# Patient Record
Sex: Female | Born: 1980 | Race: White | Hispanic: No | Marital: Married | Smoking: Current every day smoker
Health system: Southern US, Community
[De-identification: ages and names within clinical notes are randomized; demographics above are authoritative.]

## PROBLEM LIST (undated history)

## (undated) DIAGNOSIS — K649 Unspecified hemorrhoids: Secondary | ICD-10-CM

## (undated) DIAGNOSIS — K5900 Constipation, unspecified: Secondary | ICD-10-CM

## (undated) HISTORY — PX: OTHER SURGICAL HISTORY: SHX169

## (undated) HISTORY — PX: APPENDECTOMY: SHX54

## (undated) HISTORY — PX: ENDOMETRIAL ABLATION: SHX621

---
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
## 1998-07-17 ENCOUNTER — Other Ambulatory Visit: Admission: RE | Admit: 1998-07-17 | Discharge: 1998-07-17 | Payer: Self-pay | Admitting: Emergency Medicine

## 1998-08-16 ENCOUNTER — Ambulatory Visit (HOSPITAL_COMMUNITY): Admission: RE | Admit: 1998-08-16 | Discharge: 1998-08-16 | Payer: Self-pay | Admitting: Obstetrics and Gynecology

## 1998-08-16 ENCOUNTER — Encounter: Payer: Self-pay | Admitting: Obstetrics and Gynecology

## 1998-11-22 ENCOUNTER — Encounter: Payer: Self-pay | Admitting: Obstetrics and Gynecology

## 1998-11-22 ENCOUNTER — Ambulatory Visit (HOSPITAL_COMMUNITY): Admission: RE | Admit: 1998-11-22 | Discharge: 1998-11-22 | Payer: Self-pay | Admitting: Obstetrics and Gynecology

## 1998-12-13 ENCOUNTER — Inpatient Hospital Stay (HOSPITAL_COMMUNITY): Admission: AD | Admit: 1998-12-13 | Discharge: 1998-12-13 | Payer: Self-pay | Admitting: Obstetrics and Gynecology

## 1998-12-18 ENCOUNTER — Inpatient Hospital Stay (HOSPITAL_COMMUNITY): Admission: AD | Admit: 1998-12-18 | Discharge: 1998-12-20 | Payer: Self-pay | Admitting: Obstetrics and Gynecology

## 1999-07-30 ENCOUNTER — Other Ambulatory Visit: Admission: RE | Admit: 1999-07-30 | Discharge: 1999-07-30 | Payer: Self-pay | Admitting: Obstetrics and Gynecology

## 2000-12-18 ENCOUNTER — Emergency Department (HOSPITAL_COMMUNITY): Admission: EM | Admit: 2000-12-18 | Discharge: 2000-12-19 | Payer: Self-pay | Admitting: *Deleted

## 2002-06-24 ENCOUNTER — Other Ambulatory Visit: Admission: RE | Admit: 2002-06-24 | Discharge: 2002-06-24 | Payer: Self-pay | Admitting: Obstetrics and Gynecology

## 2004-07-26 ENCOUNTER — Other Ambulatory Visit: Admission: RE | Admit: 2004-07-26 | Discharge: 2004-07-26 | Payer: Self-pay | Admitting: Obstetrics and Gynecology

## 2005-08-07 ENCOUNTER — Other Ambulatory Visit: Admission: RE | Admit: 2005-08-07 | Discharge: 2005-08-07 | Payer: Self-pay | Admitting: Obstetrics and Gynecology

## 2005-09-05 ENCOUNTER — Ambulatory Visit (HOSPITAL_COMMUNITY): Admission: RE | Admit: 2005-09-05 | Discharge: 2005-09-05 | Payer: Self-pay | Admitting: Obstetrics and Gynecology

## 2009-04-24 ENCOUNTER — Emergency Department (HOSPITAL_COMMUNITY): Admission: EM | Admit: 2009-04-24 | Discharge: 2009-04-24 | Payer: Self-pay | Admitting: Emergency Medicine

## 2009-07-26 ENCOUNTER — Other Ambulatory Visit: Admission: RE | Admit: 2009-07-26 | Discharge: 2009-07-26 | Payer: Self-pay | Admitting: Obstetrics and Gynecology

## 2009-08-09 ENCOUNTER — Ambulatory Visit: Payer: Self-pay | Admitting: Internal Medicine

## 2009-08-09 DIAGNOSIS — K5909 Other constipation: Secondary | ICD-10-CM | POA: Insufficient documentation

## 2009-08-09 DIAGNOSIS — G43909 Migraine, unspecified, not intractable, without status migrainosus: Secondary | ICD-10-CM | POA: Insufficient documentation

## 2009-08-09 DIAGNOSIS — K219 Gastro-esophageal reflux disease without esophagitis: Secondary | ICD-10-CM | POA: Insufficient documentation

## 2009-08-10 ENCOUNTER — Encounter: Payer: Self-pay | Admitting: Internal Medicine

## 2009-08-13 ENCOUNTER — Encounter: Payer: Self-pay | Admitting: Internal Medicine

## 2009-08-16 ENCOUNTER — Ambulatory Visit (HOSPITAL_COMMUNITY): Admission: RE | Admit: 2009-08-16 | Discharge: 2009-08-16 | Payer: Self-pay | Admitting: Internal Medicine

## 2009-08-16 ENCOUNTER — Ambulatory Visit: Payer: Self-pay | Admitting: Internal Medicine

## 2009-08-16 HISTORY — PX: COLONOSCOPY: SHX174

## 2009-09-11 ENCOUNTER — Telehealth (INDEPENDENT_AMBULATORY_CARE_PROVIDER_SITE_OTHER): Payer: Self-pay

## 2009-09-11 ENCOUNTER — Encounter: Payer: Self-pay | Admitting: Urgent Care

## 2009-09-12 ENCOUNTER — Telehealth (INDEPENDENT_AMBULATORY_CARE_PROVIDER_SITE_OTHER): Payer: Self-pay

## 2009-09-13 ENCOUNTER — Telehealth (INDEPENDENT_AMBULATORY_CARE_PROVIDER_SITE_OTHER): Payer: Self-pay

## 2009-09-14 ENCOUNTER — Ambulatory Visit: Payer: Self-pay | Admitting: Internal Medicine

## 2009-09-14 DIAGNOSIS — K645 Perianal venous thrombosis: Secondary | ICD-10-CM

## 2009-09-14 DIAGNOSIS — K5730 Diverticulosis of large intestine without perforation or abscess without bleeding: Secondary | ICD-10-CM | POA: Insufficient documentation

## 2009-09-20 ENCOUNTER — Telehealth (INDEPENDENT_AMBULATORY_CARE_PROVIDER_SITE_OTHER): Payer: Self-pay | Admitting: *Deleted

## 2010-08-20 NOTE — Assessment & Plan Note (Signed)
Summary: NPP/ABD PAIN,CONSTIPATION,RECTAL BLEEDING,GERD,HEMORRHOIDS,+ ...   Visit Type:  Initial Consult Referring Provider:  Cyril Mourning, NP  Primary Care Provider:  Ignacia Bayley  Chief Complaint:  abd pain x 6 months, rectal bleeding x 1 year, and constipation.  History of Present Illness: 30 y/o caucasian female w/ chronic constipation.  c/o bright red blood like a period w/BM q monthy.  BM q3 days usually with scant bleeding.  Denies proctalgia or pruritis.  c/o lower abd bilat pain resolves w/ defecation.  Tried metamucil no help, suppositories no help.  "Keep hemorrhoids" & OTC meds, no relief.   The patient complains of incomplete evacuation and previous history of constipation, but denies diarrhea, nausea, vomiting, flatus, and constipation in childhood.    c/o heartburn every other day, no particular time of day.  Deneis dysphagia or odynophagia.  TUMS helps. There is no history of melena, dysphagia, hematemesis, persistent vomiting, involuntary weight loss >5%, and history of anemia.  The patient complains of regurgtaition of food, epigastric pain, and throat clearing, but denies sour taste in mouth, trouble swallowing, weight loss, weight gain, asthma, chronic cough, hoarseness, and recurrent pneumonia.  Symptoms are worse with citrus and NSAIDs.  Takes advil 600mg  two times a day almost every day for HA.    Current Problems (verified): 1)  Gerd  (ICD-530.81) 2)  Constipation, Chronic  (ICD-564.09) 3)  Hematochezia  (ICD-578.1) 4)  Migraine, Chronic  (ICD-346.90)  Current Medications (verified): 1)  Vitamin D .... Once Daily  Allergies (verified): No Known Drug Allergies  Past History:  Past Medical History: migraines endometriosis chronic constipation hemorrhoids GERD  Past Surgical History: Appendectomy Endometrial ablation-non longer has menses   Family History: No known family history of colorectal carcinoma, IBD, liver or chronic GI  problems. Father: (56)HTN Mother: (92) CAD, CVA, DM Siblings: (1 deceased age 65) MVA, CAD, HTN  Social History: married (1999) 1 son (10) healthy & step-daughter (15)  unemployed Designer, industrial/product Patient currently smokes.  8 pkyr, 1/2 ppd Alcohol Use - no Daily Caffeine Use Illicit Drug Use - no Patient gets regular exercise. Smoking Status:  current Drug Use:  no Does Patient Exercise:  yes  Review of Systems General:  Denies fever, chills, sweats, anorexia, fatigue, weakness, malaise, weight loss, and sleep disorder. ENT:  Denies earache, ear discharge, tinnitus, decreased hearing, nasal congestion, loss of smell, nosebleeds, sore throat, hoarseness, and difficulty swallowing. CV:  Denies chest pains, angina, palpitations, syncope, dyspnea on exertion, orthopnea, PND, peripheral edema, and claudication. Resp:  Denies dyspnea at rest, dyspnea with exercise, cough, sputum, wheezing, coughing up blood, and pleurisy. GI:  Denies difficulty swallowing, pain on swallowing, nausea, vomiting, vomiting blood, jaundice, and fecal incontinence. GU:  Complains of amenorrhea; denies urinary burning, blood in urine, nocturnal urination, urinary frequency, and urinary incontinence. Derm:  Denies rash, itching, dry skin, hives, moles, warts, and unhealing ulcers. Psych:  Denies depression, anxiety, memory loss, suicidal ideation, hallucinations, paranoia, phobia, and confusion. Heme:  Denies bruising and enlarged lymph nodes.  Vital Signs:  Patient profile:   30 year old female Height:      64 inches Weight:      170 pounds BMI:     29.29 Temp:     98.5 degrees F oral Pulse rate:   64 / minute BP sitting:   124 / 72  (left arm) Cuff size:   regular  Vitals Entered By: Hendricks Limes LPN (August 09, 2009 10:52 AM)  Physical Exam  General:  Well developed,  well nourished, no acute distress. Head:  Normocephalic and atraumatic. Eyes:  Sclera clear, no icterus. Ears:  Normal auditory  acuity. Nose:  No deformity, discharge,  or lesions. Mouth:  No deformity or lesions, dentition normal. Neck:  Supple; no masses or thyromegaly. Lungs:  Clear throughout to auscultation. Heart:  Regular rate and rhythm; no murmurs, rubs,  or bruits. Abdomen:  Soft, nontender and nondistended. No masses, hepatosplenomegaly or hernias noted. Normal bowel sounds.without guarding and without rebound.   Rectal:  deferred until time of colonoscopy.   Msk:  Symmetrical with no gross deformities. Normal posture. Pulses:  Normal pulses noted. Extremities:  No clubbing, cyanosis, edema or deformities noted. Neurologic:  Alert and  oriented x4;  grossly normal neurologically. Skin:  Intact without significant lesions or rashes. Cervical Nodes:  No significant cervical adenopathy. Psych:  Alert and cooperative. Normal mood and affect.  Impression & Recommendations:  Problem # 1:  HEMATOCHEZIA (ICD-578.1) 30 y/o caucasian female with intermittant large volume hematochezia.  She also has hx of chronic constipation & hemorrhoids.  Uses NSAIDs regularly.  She is going to need further evaluation via colonoscopy to determine etiology of bleeding.  Differentials include hemorrhoids, fissure, diverticula, NSAID-induced changes, less likely IBD or colorectal CA.   Diagnostic colonoscopy to be performed by Dr. Suszanne Conners Rourk in the near future.  I have discussed risks and benefits which include, but are not limited to, bleeding, infection, perforation, or medication reaction.  The patient agrees with this plan and consent will be obtained.  Orders: Consultation Level IV (91478)  Problem # 2:  CONSTIPATION, CHRONIC (ICD-564.09) See #1  Problem # 3:  GERD (ICD-530.81) Never been on PPI with almost daily symptoms of heartburn.  Trial PPI first.  If no response, consider EGD.  Patient Instructions: 1)  Samples prilosec 20mg  daily #4 pks 2)  Samples miralax 17 grams #4 pks 3)  GERD/constipation literature  given 4)  Avoid advil for now, use tylenol arthritis Prescriptions: MIRALAX  POWD (POLYETHYLENE GLYCOL 3350) 17 grams by mouth daily as needed constipation  #527 grams x 11   Entered and Authorized by:   Joselyn Arrow FNP-BC   Signed by:   Joselyn Arrow FNP-BC on 08/09/2009   Method used:   Electronically to        [facility]  [address] (retail)       [address]       [place], [place]  [address]       Ph: [phone]       Fax: [phone]   RxID:   [id number] OMEPRAZOLE 20 MG CPDR (OMEPRAZOLE) one by mouth 30 mins before breakfast daily  #30 x 1   Entered and Authorized by:   Joselyn Arrow FNP-BC   Signed by:   Joselyn Arrow FNP-BC on 08/09/2009   Method used:   Electronically to        [facility]  [address] (retail)       [address]       [place], [place]  [address]       Ph: [phone]       Fax: [phone]   RxID:   [id number]   Appended Document: NPP/ABD PAIN,CONSTIPATION,RECTAL BLEEDING,GERD,HEMORRHOIDS,+ ... 07/28/2008 labs:  CBC normal, h pylori negative, CMP normal, TSH normal

## 2010-08-20 NOTE — Assessment & Plan Note (Signed)
Summary: PP FU WITH EXTENDER IN ONE MONTH/SS   Primary Care Provider:  Ignacia Bayley  Chief Complaint:  follow up- still having problems with hemorroids.  History of Present Illness: 30 y/o caucasian female w/ recent colonoscopy by Dr Jena Gauss.  c/o"hemorrhoid flare"  Took all anusol  suppositories, prep H.  Now on Anamantle.  c/o severe proctalgia.  "can't sit"  No bleeding.  BM once daily w/ hard stools.  On stool softeners once daily.  On omeprazole for GERD.  Denies abd pain.  The patient denies any upper GI symptoms which incluse nausea, vomiting, heartburn, indigestion, dysphagia, odynophagia, anorexia or early satiety.   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 Once Daily 2)  Omeprazole 20 Mg Cpdr (Omeprazole) .... One By Mouth 30 Mins Before Breakfast Daily 3)  Miralax  Powd (Polyethylene Glycol 3350) .Marland KitchenMarland KitchenMarland Kitchen 17 Grams By Mouth Daily As Needed Constipation 4)  Anamantle Hc Forte 3-1 % Kit (Lidocaine-Hydrocortisone Ace) .... One Kit Pr Bid  Allergies (verified): No Known Drug Allergies  Past History:  Past Surgical History: Last updated: 08/09/2009 Appendectomy Endometrial ablation-non longer has menses   Past Medical History: colonoscopy(08/16/09)->hemorrhoids, friable anal canal, diverticulosis migraines endometriosis chronic constipation hemorrhoids GERD  Review of Systems      See HPI General:  Denies fever, chills, sweats, anorexia, fatigue, weakness, malaise, weight loss, and sleep disorder.  Vital Signs:  Patient profile:   30 year old female Height:      64 inches Weight:      169 pounds BMI:     29.11 Temp:     98.3 degrees F oral Pulse rate:   72 / minute BP sitting:   122 / 80  (left arm) Cuff size:   regular  Vitals  Entered By: Hendricks Limes LPN (September 14, 2009 11:04 AM)  Physical Exam  General:  Well developed, well nourished, no acute distress. Head:  Normocephalic and atraumatic. Eyes:  Sclera clear.  Conjunctivae pink Heart:  Regular rate and rhythm; no murmurs, rubs,  or bruits. Abdomen:  Umbilical ornament.  Soft, nontender and nondistended. No masses, hepatosplenomegaly or hernias noted. Normal bowel sounds.without guarding and without rebound.   Rectal:  lg external hemorrhoid and perirectal tenderness. Appears thrombosed at present.  Several other smaller ext hemorrhoids. Msk:  Symmetrical with no gross deformities. Normal posture. Neurologic:  Alert and  oriented x4;  grossly normal neurologically. Skin:  Intact without significant lesions or rashes. Psych:  Alert and cooperative. Normal mood and affect.  Impression & Recommendations:  Problem # 1:  EXTERNAL HEMORRHOIDS, THROMBOSED (ICD-455.4) Referral to surgery.  Sitz bath instructions given.  Orders: Est. Patient Level II ([address])  Problem # 2:  CONSTIPATION, CHRONIC (ICD-564.09) Increase stool softener to BID  Problem # 3:  GERD (ICD-530.81) Well-controlled.  Cont daily PPI

## 2010-08-20 NOTE — Progress Notes (Signed)
Summary: Hemorrhoids  Phone Note Call from Patient   Caller: Patient Summary of Call: Pt called and said she is still having difficulty with hemorrhoids. She completed the suppositories that was given. She has been using Prep H, cream and tucks and is having alot of problems. She uses [facility]  [phone]. Initial call taken by: Cloria Spring LPN,  September 11, 2009 9:39 AM     Appended Document: RX Trial Anamantle to [facility].  Disregard [facility] please.  Keep FU OV as planned.  Prescriptions: ANAMANTLE HC FORTE 3-1 % KIT (LIDOCAINE-HYDROCORTISONE ACE) one kit PR BID  #20 x 0   Entered and Authorized by:   Joselyn Arrow FNP-BC   Signed by:   Joselyn Arrow FNP-BC on 09/11/2009   Method used:   Electronically to        [facility] (retail)       [address] [place], [place]  [address]       Ph: [phone] or [phone]       Fax: [phone]   RxID:   [id number] ANAMANTLE HC FORTE 3-1 % KIT (LIDOCAINE-HYDROCORTISONE ACE) one kit PR BID  #20 x 0   Entered and Authorized by:   Joselyn Arrow FNP-BC   Signed by:   Joselyn Arrow FNP-BC on 09/11/2009   Method used:   Electronically to        [facility]  [address] (retail)       [address]       [place], [place]  [address]       Ph: [phone]       Fax: [phone]   RxID:   [id number]     Appended Document: Hemorrhoids Pt informed. Said she does not have a follow-up appt. Told her Ginger will call to schedule.  Appended Document: Hemorrhoids pt does have an appt 09/14/09. called and spoke with "amber" to remind Attie of her appt.

## 2010-08-20 NOTE — Progress Notes (Signed)
Summary: med for hemorrhoids not helping much  Phone Note Call from Patient   Caller: Patient Summary of Call: Pt called and said she is very swollen and burning from her hemorrhoids and the medicine is not doing very much good. York Spaniel  how long does it take for it to work well, she is miserable. (Her call back number is [phone]) Initial call taken by: Cloria Spring LPN,  September 13, 2009 11:18 AM     Appended Document: med for hemorrhoids not helping much Takes a couple days, can try sitz bath, wipes, & can ask RMR if she wants surgical referral?  Appended Document: med for hemorrhoids not helping much LMOM to call.  Appended Document: med for hemorrhoids not helping much Pt informed, and said she would like to have a referral to a surgeon. Please advise.  Appended Document: med for hemorrhoids not helping much agree

## 2010-08-20 NOTE — Letter (Signed)
Summary: DR Esmond Camper REFERRAL  DR Esmond Camper REFERRAL   Imported By: Ave Filter 09/14/2009 11:36:20  _____________________________________________________________________  External Attachment:    Type:   Image     Comment:   External Document

## 2010-08-20 NOTE — Progress Notes (Signed)
Summary: [name]  Phone Note Call from Patient   Reason for Call: Talk to Doctor Summary of Call: Pt was seen recently and had been referred to Dr Lovell Sheehan for hemorrhoids. Pt said that she is unemployed and had to pay $100 to see Dr Lovell Sheehan and he tells her that she doesn't have a hemorrhoid problem. She said that it ticks her off that we did that to her and wanted to know why. I took her number, explained to her that KJ wasn't in the office today, but I would have the office manager call her shortly. Initial call taken by: Diana Eves,  September 20, 2009 2:49 PM     Appended Document: Narmeen Kerper [phone]  Appended Document: [name] Spoke with patient.  Explained that her flares could be intermittent and that if Dr. Lovell Sheehan felt that she did not surgery at this time that was a positive DX.  She stated that what she was unhappy about was that she had discussed with KJ the possibility of having the hemorrhoids lanced so that she would not have to worry about this problem anymore.  She stated she was upset b./c he told her she did not have a problem that required intervention and she did not have the money for that; she would have never gone if she knew nothing was going to be done.  I explained that I was sorry for what had occurred with Dr. Lovell Sheehan but that he does make the final call on whether or not a procedure would need to be done and that she should express her concerns to his office as well.  Furthermore, I explained that she should continue on the recommended medications from her last office visit and that we could even set up a referral for a second opinion if she wanted; she refused stating that she doesn't have money for that.  I reminded her to please feel free to contact us if she needed anything or had a flare.  She thanked me; call terminated.  Appended Document: [name] I agree with feedback provided to the patient.

## 2010-08-20 NOTE — Letter (Signed)
Summary: [facility] ORDER  [facility] ORDER   Imported By: Diana Eves 08/13/2009 14:04:22  _____________________________________________________________________  External Attachment:    Type:   Image     Comment:   External Document

## 2010-08-20 NOTE — Letter (Signed)
Summary: [facility]   Imported By: Diana Eves 08/10/2009 16:21:13  _____________________________________________________________________  External Attachment:    Type:   Image     Comment:   External Document

## 2010-08-20 NOTE — Medication Information (Signed)
Summary: Tax adviser   Imported By: Diana Eves 09/11/2009 14:15:03  _____________________________________________________________________  External Attachment:    Type:   Image     Comment:   External Document  Appended Document: RX Folder PLease let pharmacy know OK to substitute.  Appended Document: RX Folder Per pharmacist, already found it somewhere else.

## 2010-08-20 NOTE — Progress Notes (Signed)
Summary: [facility] pharmacy  re: Anamantle  Phone Note From Pharmacy   Caller: Annabelle Harman @ [facility] Summary of Call: Annabelle Harman from [place] called in reference to the Anamantle. I told her to disregard, pt got Rx sent to [facility] pharmacy. She said  pt was not there, she was just cleaning up some little things, and would have to use generic if they filled it. Said she will discard. Initial call taken by: Cloria Spring LPN,  September 12, 2009 10:43 AM     Appended Document: [facility] pharmacy  re: Anamantle Called pt and confirmed she did get it at [facility], she said it is helping some.

## 2010-10-02 ENCOUNTER — Other Ambulatory Visit: Payer: Self-pay | Admitting: Adult Health

## 2010-10-02 ENCOUNTER — Other Ambulatory Visit (HOSPITAL_COMMUNITY)
Admission: RE | Admit: 2010-10-02 | Discharge: 2010-10-02 | Disposition: A | Discharge status: Home / Self Care | Payer: 59 | Source: Ambulatory Visit | Attending: Obstetrics and Gynecology | Admitting: Obstetrics and Gynecology

## 2010-10-02 DIAGNOSIS — Z01419 Encounter for gynecological examination (general) (routine) without abnormal findings: Secondary | ICD-10-CM | POA: Insufficient documentation

## 2010-10-07 LAB — TSH: TSH: 1.984 u[IU]/mL (ref 0.350–4.500)

## 2010-10-07 LAB — CALCIUM: Calcium: 8.5 mg/dL (ref 8.4–10.5)

## 2010-10-25 LAB — COMPREHENSIVE METABOLIC PANEL
ALT: 27 U/L (ref 0–35)
Calcium: 8.6 mg/dL (ref 8.4–10.5)
Creatinine, Ser: 0.6 mg/dL (ref 0.4–1.2)
GFR calc Af Amer: >60 mL/min (ref >60)
GFR calc non Af Amer: >60 mL/min (ref >60)
Glucose, Bld: 94 mg/dL (ref 70–99)
Sodium: 135 mEq/L (ref 135–145)
Total Protein: 6.8 g/dL (ref 6.0–8.3)

## 2010-10-25 LAB — URINALYSIS, ROUTINE W REFLEX MICROSCOPIC
Glucose, UA: NEGATIVE mg/dL
Ketones, ur: NEGATIVE mg/dL
Nitrite: NEGATIVE
Specific Gravity, Urine: 1.019 (ref 1.005–1.030)
pH: 6 (ref 5.0–8.0)

## 2010-10-25 LAB — DIFFERENTIAL
Eosinophils Absolute: 0 10*3/uL (ref 0.0–0.7)
Lymphocytes Relative: 18 % (ref 12–46)
Lymphs Abs: 1.4 10*3/uL (ref 0.7–4.0)
Monocytes Relative: 6 % (ref 3–12)
Neutrophils Relative %: 75 % (ref 43–77)

## 2010-10-25 LAB — LIPASE, BLOOD: Lipase: 20 U/L (ref 11–59)

## 2010-10-25 LAB — CBC
Hemoglobin: 14.3 g/dL (ref 12.0–15.0)
MCHC: 33.9 g/dL (ref 30.0–36.0)
MCV: 96.6 fL (ref 78.0–100.0)
RDW: 13 % (ref 11.5–15.5)

## 2010-12-06 NOTE — Op Note (Signed)
[name], [name]                ACCOUNT NO.:  [id number]   MEDICAL RECORD NO.:  [id number]          PATIENT TYPE:  AMB   LOCATION:  SDC                           FACILITY:  [facility]   PHYSICIAN:  Zenaida Niece, M.D.DATE OF BIRTH:  1981-05-06   DATE OF PROCEDURE:  09/05/2005  DATE OF DISCHARGE:                                 OPERATIVE REPORT   PREOPERATIVE DIAGNOSIS:  Pelvic pain, dysmenorrhea, dyspareunia,  menorrhagia.   POSTOPERATIVE DIAGNOSIS:  Pelvic pain, dysmenorrhea, dyspareunia,  menorrhagia. Possible endometriosis.   PROCEDURES:  Diagnostic laparoscopy with fulguration of possible  endometriosis and removal of two free small staples followed by NovaSure  endometrial ablation.   SURGEON:  Zenaida Niece, M.D.   ANESTHESIA:  General and local.   SPECIMENS:  None.   ESTIMATED BLOOD LOSS:  Minimal.   COMPLICATIONS:  None.   FINDINGS:  The NovaSure device used the uterine depth of 4.5 cm and a  uterine width of 4.7 cm and used 116 watts for 79 seconds. With the  laparoscopy there were small adhesions to the left pelvic brim and two small  free staples from her previous appendectomy, one in the posterior cul-de-sac  and one in the right anterior cul-de-sac. There were some lesions consistent  with possible endometriosis just inferior to the right uterosacral ligament.   PROCEDURE IN DETAIL:  The patient was taken to the operating room and placed  in the dorsosupine position. General anesthesia was induced and she was  placed in mobile stirrups and left arm tucked to her side. Abdomen, perineum  and vagina were then prepped and draped in the usual sterile fashion,  bladder drained with a red rubber catheter, Hulka tenaculum applied to the  cervix for used for uterine manipulation. Infraumbilical skin was then  infiltrated with quarter percent Marcaine and a 1 cm vertical incision was  made in her previous scar. The Veress needle was inserted into the  peritoneal  cavity and placement confirmed by the water drop test and opening  pressure of 3 mmHg. CO2 gas was then insufflated to a pressure of 12 mmHg  and the Veress needle was removed. With direct visualization a 5 mm XL  trocar was introduced. A 5 mm port was then placed on the left side under  direct visualization. Inspection revealed normal uterus, tubes and ovaries.  Normal ovarian fossa. There were again some clear to whitish lesions just  inferior to the right uterosacral ligament which could have been consistent  with endometriosis. A free staple was on the peritoneum in the posterior cul-  de-sac. Another free staple was noticed in the right anterior cul-de-sac.  There were a few adhesions of the colon to the left pelvic brim. No other  significant abnormalities were found. The adhesions to the left pelvic brim  were taken down with blunt dissection and minimal sharp dissection. The two  staples were grasped and removed from the peritoneum. The lesions consistent  with endometriosis anterior to the right uterine uterosacral ligament were  fulgurated with bipolar cautery. These were inferior to the ureter. The  remainder  of the abdomen appeared normal. No other source of pain was  identified. The 5 mm port was removed under direct visualization. The  laparoscope was removed and all gas allowed to deflate from the abdomen. The  umbilical trocar was then removed. Skin incisions were closed with  interrupted subcuticular sutures of 4-0 Vicryl Rapide followed by Dermabond.   Attention was turned to the endometrial ablation. The legs were elevated in  the mobile stirrups. The Hulka tenaculum was removed and a Graves speculum  was inserted into the vagina. The anterior lip of the cervix was grasped  with a single-tooth tenaculum. A paracervical block was then performed with  16 mL of 2% plain lidocaine. Uterus then sounded to approximately 8.5 cm and  the cervix measured 4 cm with a size 7  dilator. The cervix was then  gradually easily dilated to a size 25 dilator. The NovaSure device was then  gently inserted and deployed appropriately. Uterine width turned out to be  4.7 cm. The CO2 test was passed and endometrial ablation was performed as  mentioned above without complications. The NovaSure device was then removed  and the array was found to be intact. The single-tooth tenaculum was  removed. Bleeding was controlled with pressure. All instruments were then  removed from the vagina. The patient was taken down from stirrups. She was  awakened in the operating room, tolerated the procedure well and was taken  to the recovery room in stable condition. Counts were correct and she  received no antibiotics. She did have PAS hose on throughout procedure and  received Toradol prior to endometrial ablation.      Zenaida Niece, M.D.  Electronically Signed     TDM/MEDQ  D:  09/05/2005  T:  09/05/2005  Job:  [id number]

## 2010-12-06 NOTE — H&P (Signed)
NAME:  [name], [name]                ACCOUNT NO.:  [id number]   MEDICAL RECORD NO.:  [id number]          PATIENT TYPE:  AMB   LOCATION:  [facility]                           FACILITY:  [facility]   PHYSICIAN:  Leighton Roach Meisinger, M.D.DATE OF BIRTH:  1981-05-15   DATE OF ADMISSION:  DATE OF DISCHARGE:                                HISTORY & PHYSICAL   CHIEF COMPLAINT:  Pelvic pain, dysmenorrhea, and dyspareunia as well as  menorrhagia.   HISTORY OF PRESENT ILLNESS:  This is a 30 year old white female, para 1-0-0-  1, whom I saw for an annual exam in January this year.  She has regular  menses every month which are heavy and crampy and she does avoid activities  during her period.  She also has pelvic cramping which is increased with  activity and with intercourse.  She has seen Dr. Mickel Crow for urinary  symptoms of frequency and apparently he does not feel she has interstitial  cystitis.  Pelvic exam revealed a small retroverted uterus which was  slightly tender and slight tenderness in bilateral adnexa without masses.  All options have been discussed with the patient and she wishes to proceed  with a laparoscopic evaluation of the pelvis.  Her partner has also had a  vasectomy and she wishes to undergo endometrial ablation for her heavy and  crampy periods.  She has had an endometrial biopsy with a benign  endometrium.  She has had a pelvic ultrasound with normal uterus and  ovaries.   PAST OBSTETRIC HISTORY:  Significant for one vacuum-assisted vaginal  delivery at term without complications.   PAST MEDICAL HISTORY:  Essentially negative.   PAST SURGICAL HISTORY:  Significant for appendectomy.   ALLERGIES:  None known.   CURRENT MEDICATIONS:  None.   SOCIAL HISTORY:  The patient is married and does smoke half a pack of  cigarettes a day.  She denies alcohol or drug use.   REVIEW OF SYSTEMS:  She does have some hemorrhoids and again she is seeing  Dr. Mickel Crow for urinary frequency.   FAMILY HISTORY:  Maternal aunt with breast cancer.   PHYSICAL EXAMINATION:  VITAL SIGNS:  Weight is 173, blood pressure 108/68,  pulse 70.  GENERAL:  This is a well-developed white female in no acute distress.  NECK:  Supple without lymphadenopathy or thyromegaly.  LUNGS:  Clear to auscultation.  HEART:  Regular rate and rhythm without murmur.  ABDOMEN:  Soft, nondistended without palpable masses.  She is slightly  tender in both lower quadrants.  EXTREMITIES:  Have no edema and are nontender.  PELVIC:  External genitalia has no lesions.  On speculum exam, the cervix is  normal and Pap smear is also normal.  On bimanual exams, again she does have  a retroverted uterus which is slightly tender but normal size and she has  bilateral adnexal tenderness without mass.   ASSESSMENT:  Chronic persistent pelvic pain with dysmenorrhea and  dyspareunia.  All options have been discussed with the patient.  She has  previously been treated with antibiotics without improvement.  She wishes  to  undergo laparoscopic evaluation.  Dr. Etta Grandchild has evaluated her and does not  feel she has interstitial cystitis and she does have no bowel symptoms.  Risks of surgery have been discussed with the patient.  She also wishes to  undergo endometrial ablation to see if that will  help with her bleeding as  well as her cramping.  Again risks of all procedures have been discussed.   PLAN:  Admit the patient on the day of surgery for laparoscopy with possible  adhesiolysis or fulguration of endometriosis.  She will also have  endometrial ablation with the NovaSure device.      Zenaida Niece, M.D.  Electronically Signed     TDM/MEDQ  D:  09/04/2005  T:  09/04/2005  Job:  [id number]

## 2011-01-22 IMAGING — US US ABDOMEN COMPLETE
1 series · 14 of 25 positions shown · non-contrast
Comparison: No prior studies.

CLINICAL DATA: Right upper quadrant abdominal pain.

COMPLETE ABDOMINAL ULTRASOUND

[Series 1: us abdomen complete · 0.30mm/px · 14 of 57 slices shown]
[im 1/57]
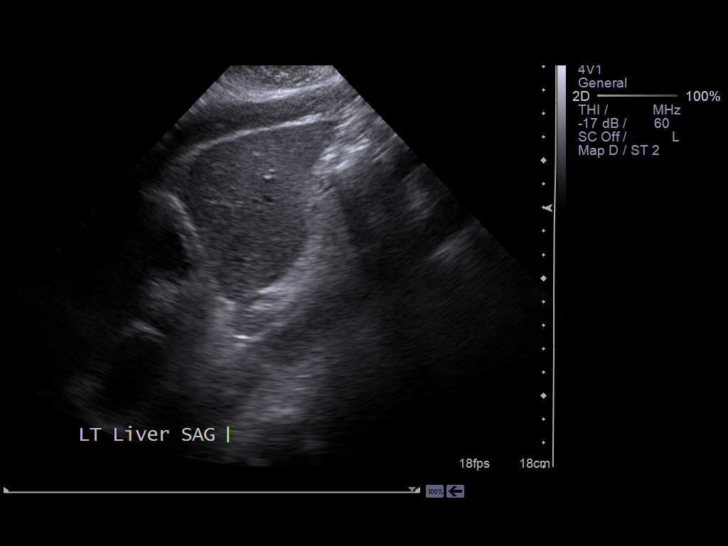
[im 5/57]
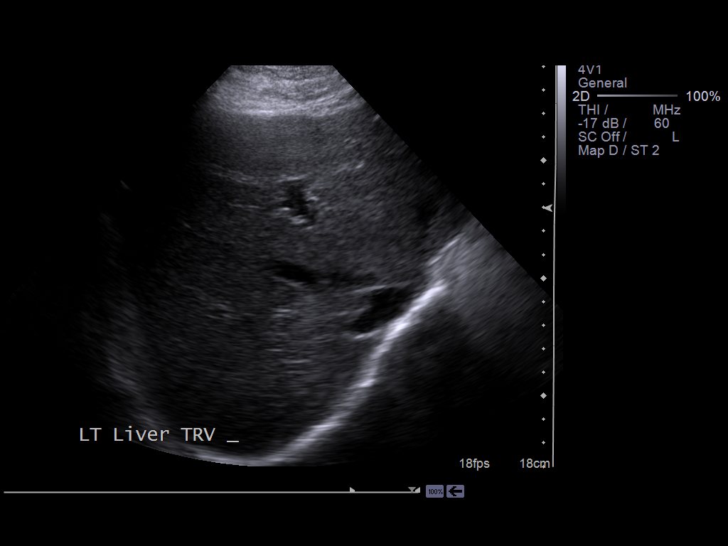
[im 10/57]
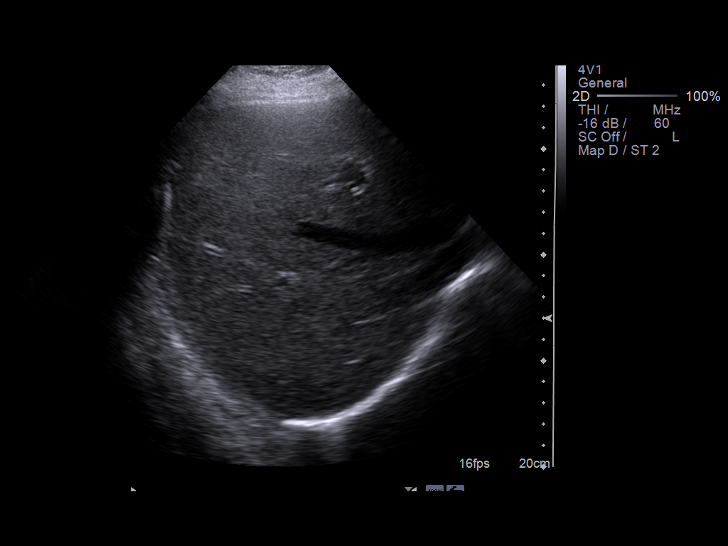
[im 15/57]
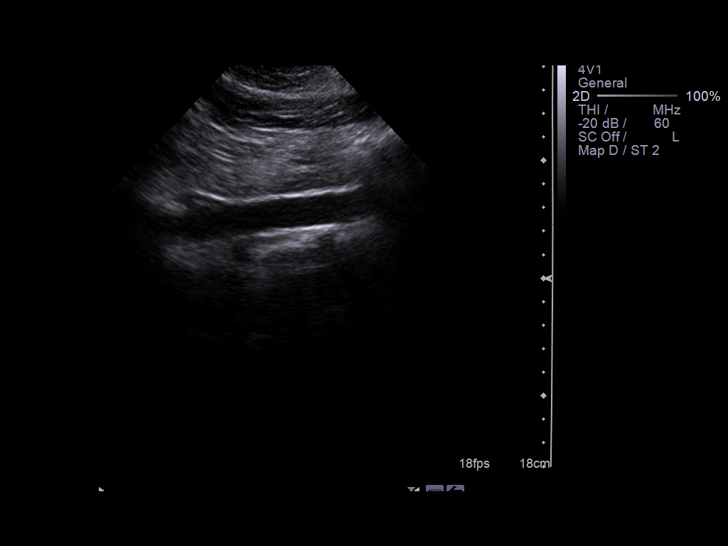
[im 19/57]
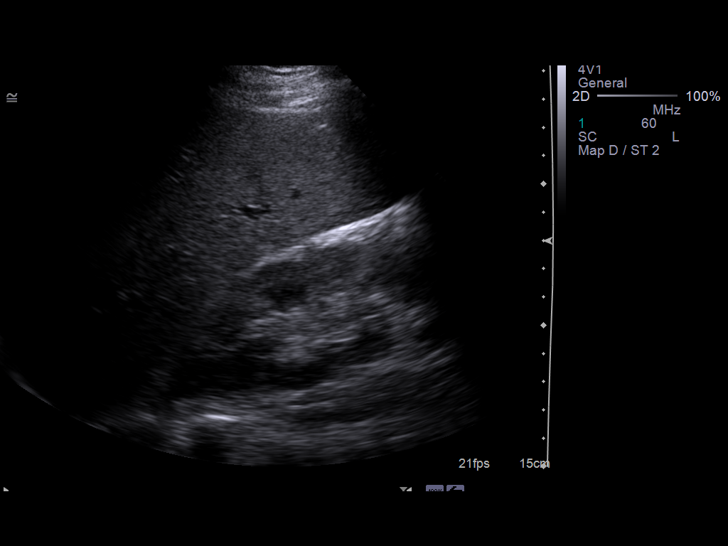
[im 22/57]
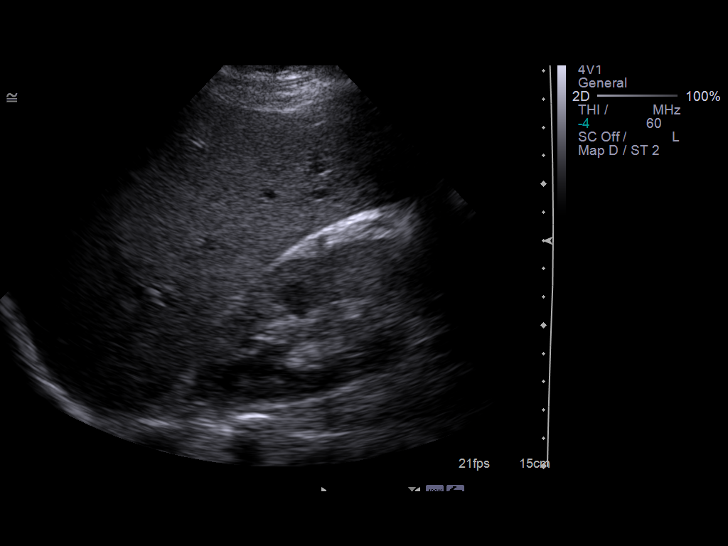
[im 26/57]
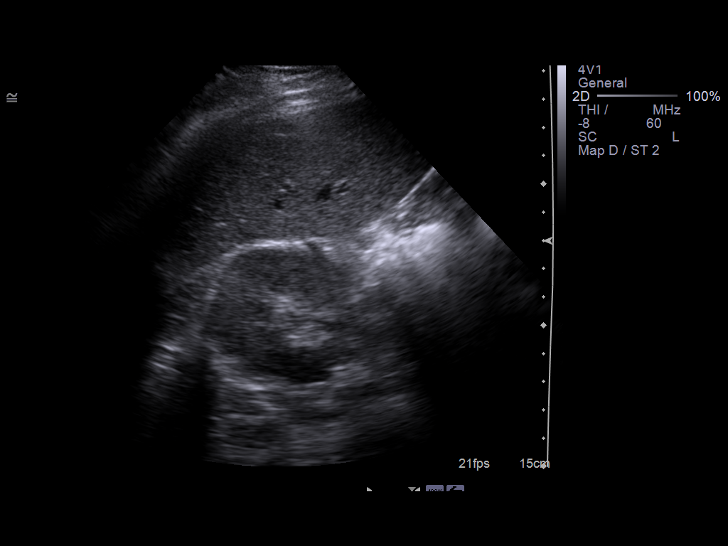
[im 31/57]
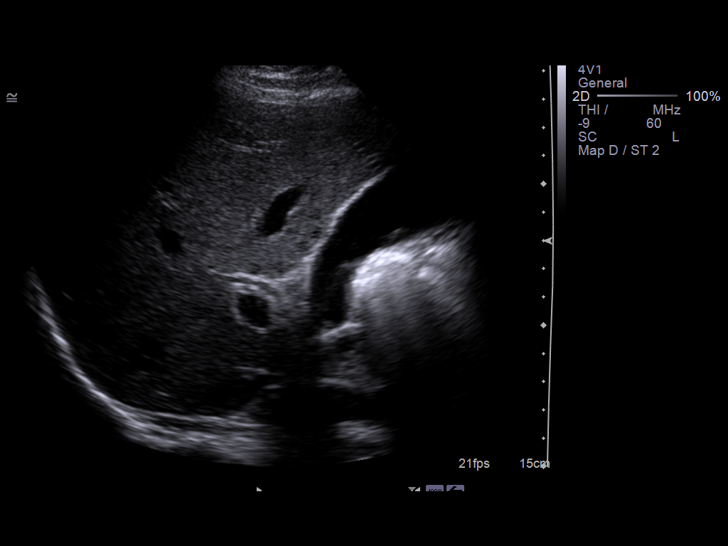
[im 36/57]
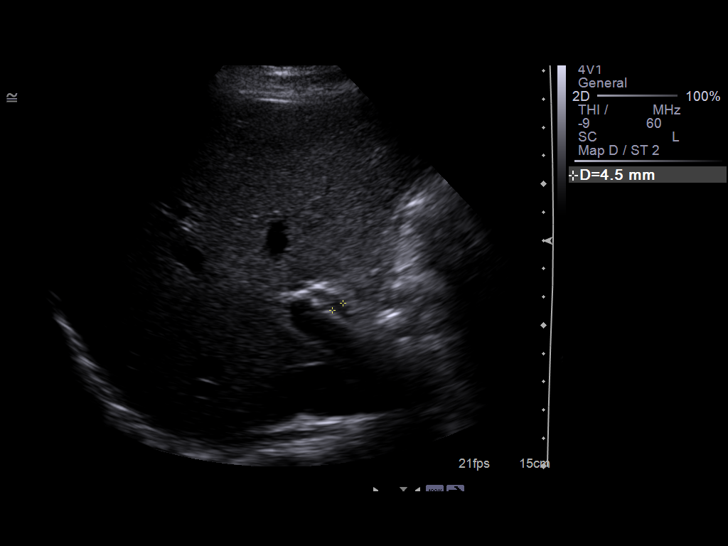
[im 38/57]
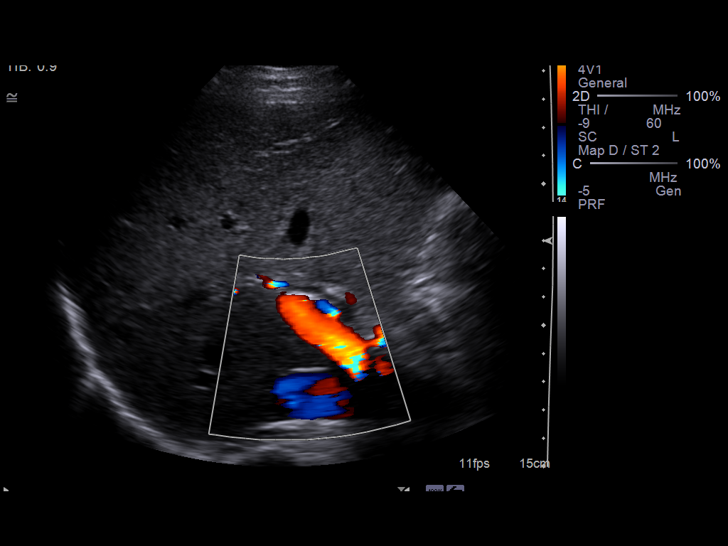
[im 43/57]
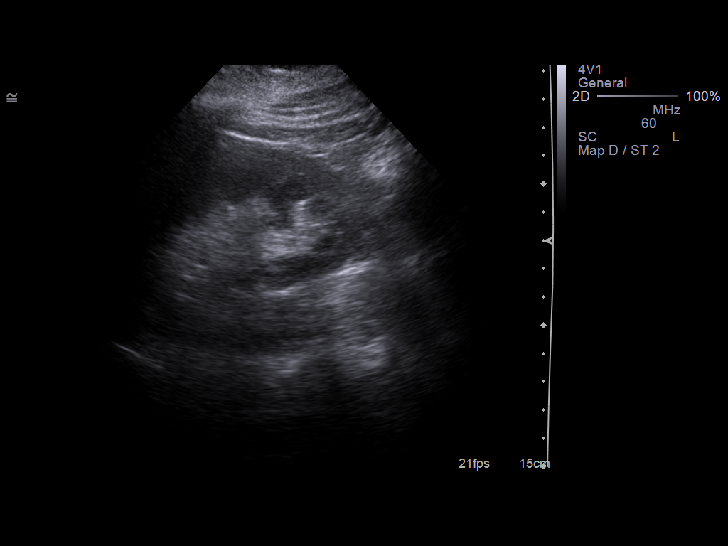
[im 47/57]
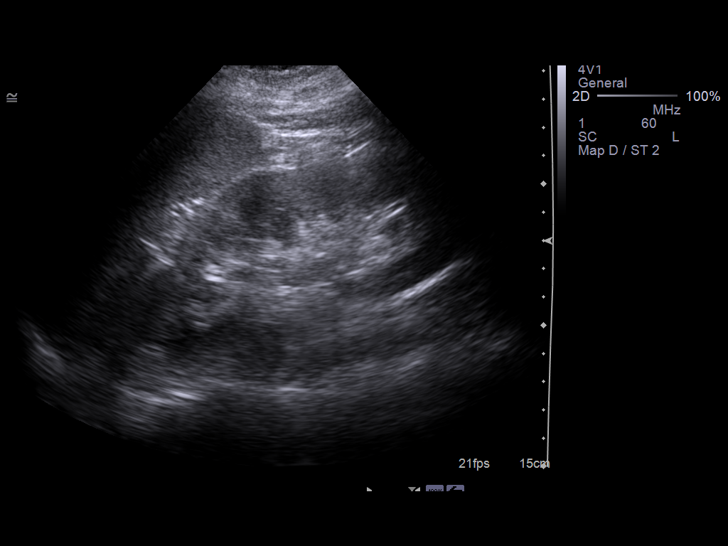
[im 52/57]
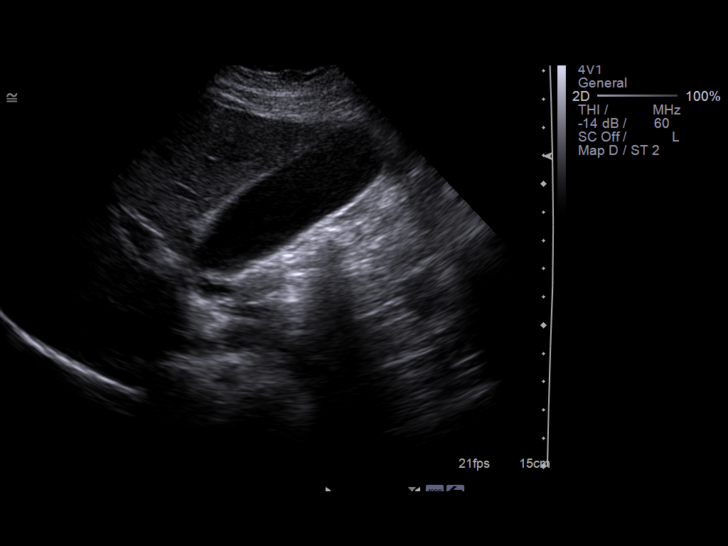
[im 57/57]
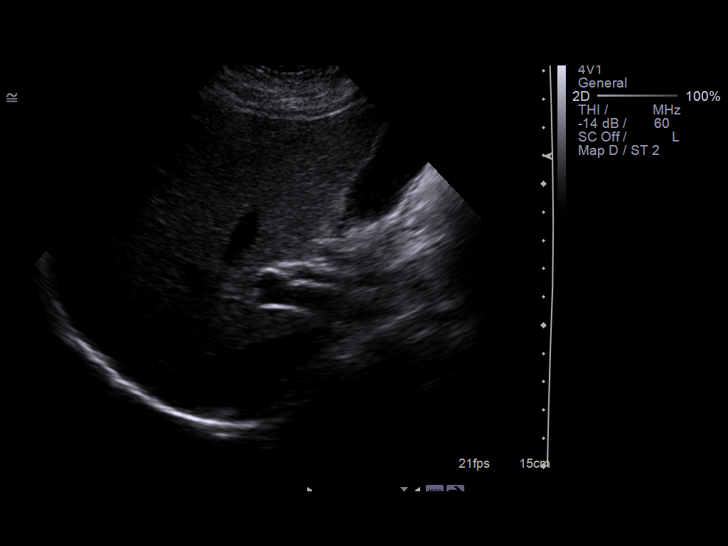

[14 of 25 positions shown; findings below may reference images not displayed]

FINDINGS: Gallbladder:  No gallstones, gallbladder wall thickening, or
pericholecystic fluid.

Common bile duct:  The common bile duct is normal in caliber
measuring 5 mm in diameter.

Liver:  There are no focal hepatic abnormalities.  Portions of the
dome of the right lobe liver could not be visualized.

IVC:  Appears normal.

Pancreas:  The head and body of the pancreas are visualized and
have normal appearance.  The tail of the pancreas is obscured by
bowel gas.

Spleen:  Spleen is normal in appearance with a splenic length of
6.8 cm.

Right Kidney:  The right kidney measures 13.1 cm length with no
hydronephrosis or focal abnormality.

Left Kidney:  The left kidney measures 13.0 cm in length with no
hydronephrosis or focal abnormality.

Abdominal aorta:  No aneurysm identified.
IMPRESSION: Normal-appearing gallbladder and common bile duct.  Incomplete
visualization of the tail of the pancreas due to overlying bowel
gas.

## 2012-11-29 ENCOUNTER — Telehealth: Payer: Self-pay | Admitting: Nurse Practitioner

## 2012-11-29 NOTE — Telephone Encounter (Signed)
PT WOULD BE NEW. NOT SEEN> 3 YEARS. EXPLAINED TO PT TO GO TO URGENT CARE OR ER.

## 2014-02-16 ENCOUNTER — Emergency Department (HOSPITAL_BASED_OUTPATIENT_CLINIC_OR_DEPARTMENT_OTHER)
Admission: EM | Admit: 2014-02-16 | Discharge: 2014-02-16 | Disposition: A | Discharge status: Home / Self Care | Payer: 59 | Attending: Emergency Medicine | Admitting: Emergency Medicine

## 2014-02-16 ENCOUNTER — Encounter (HOSPITAL_BASED_OUTPATIENT_CLINIC_OR_DEPARTMENT_OTHER): Payer: Self-pay | Admitting: Emergency Medicine

## 2014-02-16 ENCOUNTER — Emergency Department (HOSPITAL_BASED_OUTPATIENT_CLINIC_OR_DEPARTMENT_OTHER): Payer: 59

## 2014-02-16 DIAGNOSIS — Z79899 Other long term (current) drug therapy: Secondary | ICD-10-CM | POA: Diagnosis not present

## 2014-02-16 DIAGNOSIS — R110 Nausea: Secondary | ICD-10-CM | POA: Diagnosis not present

## 2014-02-16 DIAGNOSIS — K5900 Constipation, unspecified: Secondary | ICD-10-CM | POA: Insufficient documentation

## 2014-02-16 DIAGNOSIS — F17200 Nicotine dependence, unspecified, uncomplicated: Secondary | ICD-10-CM | POA: Insufficient documentation

## 2014-02-16 HISTORY — DX: Constipation, unspecified: K59.00

## 2014-02-16 MED ORDER — ONDANSETRON 4 MG PO TBDP
4.0000 mg | ORAL_TABLET | Freq: Once | ORAL | Status: AC
  Administered 2014-02-16: 4 mg via ORAL
  Filled 2014-02-16: qty 1

## 2014-02-16 MED ORDER — ONDANSETRON HCL 4 MG PO TABS: 4.0000 mg | ORAL_TABLET | Freq: Four times a day (QID) | ORAL | Status: DC

## 2014-02-16 NOTE — Discharge Instructions (Signed)
Return to the ED with any concerns including vomiting and not able to keep down liquids, worsening abdominal pain, fever/chills, decreased level of alertness/lethargy, or any other alarming symptoms  You should do a fleets enema once and then repeat in 12 hours, increase miralax to three times daily until stools are soft but not liquid, if you pass liquid stool then decrease miralax down to twice or once daily.  You can also add magnesium citrate from the drug store.    The xray does not show obstruction, but does show chronic constipation.

## 2014-02-16 NOTE — ED Notes (Signed)
She is trying to drink ginger ale slowly to avoid vomiting.

## 2014-02-16 NOTE — ED Notes (Signed)
Constipation x 2 weeks.  Was seen by PMD 1 week ago, has been taking laxatives, increasing fiber and drinking 2 liters of water daily without relief.

## 2014-02-16 NOTE — ED Provider Notes (Signed)
CSN: [id number]     Arrival date & time 02/16/14  1111 History   First MD Initiated Contact with Patient 02/16/14 1138     Chief Complaint  Patient presents with  . Constipation     (Consider location/radiation/quality/duration/timing/severity/associated sxs/prior Treatment) HPI Pt presenting with c/o abdominal pain associated with worsening of her chronic constipation. She states that she saw her PMD one week ago and was advised to take miralax, once daily, she has increased this to twice daily.  She has been increasing her water intake.  She has had some nausea with an episode of emesis today.  Denies hx of prior surgery- PMH reflects hyesterectomy.  She states that it is fairly usual for her to have to use enemas to have a bowel movement- she last used an enema 2 weeks ago and had good output.  She has continued to pass stools, but small and hard.  There are no other associated systemic symptoms, there are no other alleviating or modifying factors.   Past Medical History  Diagnosis Date  . Constipation    Past Surgical History  Procedure Laterality Date  . Abdominal hysterectomy    . Appendectomy     No family history on file. History  Substance Use Topics  . Smoking status: Current Every Day Smoker -- 0.50 packs/day    Types: Cigarettes  . Smokeless tobacco: Not on file  . Alcohol Use: Yes     Comment: occasional   OB History   Grav Para Term Preterm Abortions TAB SAB Ect Mult Living                 Review of Systems ROS reviewed and all otherwise negative except for mentioned in HPI    Allergies  Review of patient's allergies indicates no known allergies.  Home Medications   Prior to Admission medications   Medication Sig Start Date End Date Taking? Authorizing Provider  BEE POLLEN PO Take by mouth.   Yes Historical Provider, MD  cetirizine (ZYRTEC) 10 MG tablet Take 10 mg by mouth daily.   Yes Historical Provider, MD  ondansetron (ZOFRAN) 4 MG tablet Take 1  tablet (4 mg total) by mouth every 6 (six) hours. 02/16/14   Ethelda Chick, MD   BP 106/65  Pulse 70  Temp(Src) 98.3 F (36.8 C) (Oral)  Resp 18  SpO2 99% Vitals reviewed Physical Exam Physical Examination: General appearance - alert, well appearing, and in no distress Mental status - alert, oriented to person, place, and time Eyes - no conjunctival injection, no scleral icterus Mouth - mucous membranes moist, pharynx normal without lesions Chest - clear to auscultation, no wheezes, rales or rhonchi, symmetric air entry Heart - normal rate, regular rhythm, normal S1, S2, no murmurs, rubs, clicks or gallops Abdomen - soft, diffuse mild tenderness, nabs, nondistended, no masses or organomegaly Extremities - peripheral pulses normal, no pedal edema, no clubbing or cyanosis Skin - normal coloration and turgor, no rashes  ED Course  Procedures (including critical care time) Labs Review Labs Reviewed - No data to display  Imaging Review Dg Abd 2 Views  02/16/2014   CLINICAL DATA:  Right upper quadrant abdominal pain and nausea. Constipation.  EXAM: ABDOMEN - 2 VIEW  COMPARISON:  One-view abdomen 02/09/2014.  FINDINGS: The lung bases are clear. The bowel gas pattern is unremarkable. The stool burden is decreased since the prior exam. There is no evidence for obstruction or free air. The axial skeleton is within normal limits. Surgical  clips in the right lower quadrant suggest appendectomy.  IMPRESSION: 1. No acute or focal abnormality in the abdomen or lower chest. 2. Decreased stool burden since the prior exam.   Electronically Signed   By: Gennette Pachris  Mattern M.D.   On: 02/16/2014 12:37     EKG Interpretation None      MDM   Final diagnoses:  Constipation, unspecified constipation type    Pt with hx of chronic constipation presents with worsening constipation.  Xray of abdomen shows no signs of obstruction, decreased stool burden since prior.   Xray images reviewed and interpreted by  me as well.   Advised patient to do enema x 2, 12 hours apart, can increase miralax to three times daily, then decrease once she is having soft stools.  Advised continued followup with PMD.  Discharged with strict return precautions.  Pt agreeable with plan.     Ethelda ChickMartha K Linker, MD 02/17/14 2018

## 2014-02-20 ENCOUNTER — Encounter: Payer: Self-pay | Admitting: Internal Medicine

## 2014-03-16 ENCOUNTER — Emergency Department (HOSPITAL_COMMUNITY): Payer: 59

## 2014-03-16 ENCOUNTER — Emergency Department (HOSPITAL_COMMUNITY)
Admission: EM | Admit: 2014-03-16 | Discharge: 2014-03-16 | Disposition: A | Discharge status: Home / Self Care | Payer: 59 | Attending: Emergency Medicine | Admitting: Emergency Medicine

## 2014-03-16 ENCOUNTER — Encounter (HOSPITAL_COMMUNITY): Payer: Self-pay | Admitting: Emergency Medicine

## 2014-03-16 DIAGNOSIS — Z79899 Other long term (current) drug therapy: Secondary | ICD-10-CM | POA: Insufficient documentation

## 2014-03-16 DIAGNOSIS — Z9889 Other specified postprocedural states: Secondary | ICD-10-CM | POA: Diagnosis not present

## 2014-03-16 DIAGNOSIS — R109 Unspecified abdominal pain: Secondary | ICD-10-CM | POA: Diagnosis present

## 2014-03-16 DIAGNOSIS — R1084 Generalized abdominal pain: Secondary | ICD-10-CM | POA: Insufficient documentation

## 2014-03-16 DIAGNOSIS — K644 Residual hemorrhoidal skin tags: Secondary | ICD-10-CM | POA: Diagnosis not present

## 2014-03-16 DIAGNOSIS — F17200 Nicotine dependence, unspecified, uncomplicated: Secondary | ICD-10-CM | POA: Diagnosis not present

## 2014-03-16 DIAGNOSIS — K625 Hemorrhage of anus and rectum: Secondary | ICD-10-CM | POA: Diagnosis not present

## 2014-03-16 LAB — URINALYSIS, ROUTINE W REFLEX MICROSCOPIC
BILIRUBIN URINE: NEGATIVE
Glucose, UA: NEGATIVE mg/dL
HGB URINE DIPSTICK: NEGATIVE
Ketones, ur: NEGATIVE mg/dL
Leukocytes, UA: NEGATIVE
Nitrite: NEGATIVE
Protein, ur: NEGATIVE mg/dL
SPECIFIC GRAVITY, URINE: 1.01 (ref 1.005–1.030)
Urobilinogen, UA: 0.2 mg/dL (ref 0.0–1.0)
pH: 6.5 (ref 5.0–8.0)

## 2014-03-16 LAB — CBC WITH DIFFERENTIAL/PLATELET
BASOS ABS: 0 10*3/uL (ref 0.0–0.1)
BASOS PCT: 0 % (ref 0–1)
Eosinophils Absolute: 0.1 10*3/uL (ref 0.0–0.7)
Eosinophils Relative: 1 % (ref 0–5)
HCT: 40.6 % (ref 36.0–46.0)
Hemoglobin: 14.2 g/dL (ref 12.0–15.0)
Lymphocytes Relative: 29 % (ref 12–46)
Lymphs Abs: 2.7 10*3/uL (ref 0.7–4.0)
MCH: 33.3 pg (ref 26.0–34.0)
MCHC: 35 g/dL (ref 30.0–36.0)
MCV: 95.3 fL (ref 78.0–100.0)
Monocytes Absolute: 0.5 10*3/uL (ref 0.1–1.0)
Monocytes Relative: 5 % (ref 3–12)
NEUTROS ABS: 6 10*3/uL (ref 1.7–7.7)
NEUTROS PCT: 65 % (ref 43–77)
Platelets: 256 10*3/uL (ref 150–400)
RBC: 4.26 MIL/uL (ref 3.87–5.11)
RDW: 12.4 % (ref 11.5–15.5)
WBC: 9.2 10*3/uL (ref 4.0–10.5)

## 2014-03-16 LAB — COMPREHENSIVE METABOLIC PANEL
ALBUMIN: 4 g/dL (ref 3.5–5.2)
ALK PHOS: 70 U/L (ref 39–117)
ALT: 16 U/L (ref 0–35)
AST: 17 U/L (ref 0–37)
Anion gap: 14 (ref 5–15)
BILIRUBIN TOTAL: 0.3 mg/dL (ref 0.3–1.2)
BUN: 13 mg/dL (ref 6–23)
CHLORIDE: 100 meq/L (ref 96–112)
CO2: 22 mEq/L (ref 19–32)
Calcium: 8.9 mg/dL (ref 8.4–10.5)
Creatinine, Ser: 0.7 mg/dL (ref 0.50–1.10)
GFR calc Af Amer: >90 mL/min (ref >90)
GFR calc non Af Amer: >90 mL/min (ref >90)
Glucose, Bld: 89 mg/dL (ref 70–99)
POTASSIUM: 3.8 meq/L (ref 3.7–5.3)
Sodium: 136 mEq/L — ABNORMAL LOW (ref 137–147)
Total Protein: 6.9 g/dL (ref 6.0–8.3)

## 2014-03-16 LAB — LIPASE, BLOOD: Lipase: 29 U/L (ref 11–59)

## 2014-03-16 MED ORDER — ONDANSETRON HCL 4 MG PO TABS: 4.0000 mg | ORAL_TABLET | Freq: Three times a day (TID) | ORAL | Status: DC | PRN

## 2014-03-16 MED ORDER — SODIUM CHLORIDE 0.9 % IV BOLUS (SEPSIS)
1000.0000 mL | Freq: Once | INTRAVENOUS | Status: AC
  Administered 2014-03-16: 1000 mL via INTRAVENOUS

## 2014-03-16 MED ORDER — HYDROCODONE-ACETAMINOPHEN 5-325 MG PO TABS: 1.0000 | ORAL_TABLET | ORAL | Status: DC | PRN

## 2014-03-16 MED ORDER — ONDANSETRON HCL 4 MG/2ML IJ SOLN
4.0000 mg | Freq: Once | INTRAMUSCULAR | Status: AC
  Administered 2014-03-16: 4 mg via INTRAVENOUS
  Filled 2014-03-16: qty 2

## 2014-03-16 MED ORDER — IOHEXOL 300 MG/ML  SOLN
100.0000 mL | Freq: Once | INTRAMUSCULAR | Status: AC | PRN
Start: 2014-03-16 — End: 2014-03-16
  Administered 2014-03-16: 100 mL via INTRAVENOUS

## 2014-03-16 MED ORDER — MORPHINE SULFATE 4 MG/ML IJ SOLN
4.0000 mg | Freq: Once | INTRAMUSCULAR | Status: AC
  Administered 2014-03-16: 4 mg via INTRAVENOUS
  Filled 2014-03-16: qty 1

## 2014-03-16 MED ORDER — IOHEXOL 300 MG/ML  SOLN
50.0000 mL | Freq: Once | INTRAMUSCULAR | Status: AC | PRN
  Administered 2014-03-16: 50 mL via ORAL

## 2014-03-16 MED ORDER — IBUPROFEN 800 MG PO TABS
800.0000 mg | ORAL_TABLET | Freq: Once | ORAL | Status: DC
  Filled 2014-03-16: qty 1

## 2014-03-16 MED ORDER — SODIUM CHLORIDE 0.9 % IJ SOLN
INTRAMUSCULAR | Status: AC
  Filled 2014-03-16: qty 250

## 2014-03-16 NOTE — Discharge Instructions (Signed)
Abdominal Pain °Many things can cause abdominal pain. Usually, abdominal pain is not caused by a disease and will improve without treatment. It can often be observed and treated at home. Your health care provider will do a physical exam and possibly order blood tests and X-rays to help determine the seriousness of your pain. However, in many cases, more time must pass before a clear cause of the pain can be found. Before that point, your health care provider may not know if you need more testing or further treatment. °HOME CARE INSTRUCTIONS  °Monitor your abdominal pain for any changes. The following actions may help to alleviate any discomfort you are experiencing: °· Only take over-the-counter or prescription medicines as directed by your health care provider. °· Do not take laxatives unless directed to do so by your health care provider. °· Try a clear liquid diet (broth, tea, or water) as directed by your health care provider. Slowly move to a bland diet as tolerated. °SEEK MEDICAL CARE IF: °· You have unexplained abdominal pain. °· You have abdominal pain associated with nausea or diarrhea. °· You have pain when you urinate or have a bowel movement. °· You experience abdominal pain that wakes you in the night. °· You have abdominal pain that is worsened or improved by eating food. °· You have abdominal pain that is worsened with eating fatty foods. °· You have a fever. °SEEK IMMEDIATE MEDICAL CARE IF:  °· Your pain does not go away within 2 hours. °· You keep throwing up (vomiting). °· Your pain is felt only in portions of the abdomen, such as the right side or the left lower portion of the abdomen. °· You pass bloody or black tarry stools. °MAKE SURE YOU: °· Understand these instructions.   °· Will watch your condition.   °· Will get help right away if you are not doing well or get worse.   °Document Released: 04/16/2005 Document Revised: 07/12/2013 Document Reviewed: 03/16/2013 °[employer]® Patient Information  ©2015 [employer]. This information is not intended to replace advice given to you by your health care provider. Make sure you discuss any questions you have with your health care provider. ° °Bloody Stools °Bloody stools often mean that there is a problem in the digestive tract. Your caregiver may use the term "melena" to describe black, tarry, and bad smelling stools or "hematochezia" to describe red or maroon-colored stools. Blood seen in the stool can be caused by bleeding anywhere along the intestinal tract.  °A black stool usually means that blood is coming from the upper part of the gastrointestinal tract (esophagus, stomach, or small bowel). Passing maroon-colored stools or bright red blood usually means that blood is coming from lower down in the large bowel or the rectum. However, sometimes massive bleeding in the stomach or small intestine can cause bright red bloody stools.  °Consuming black licorice, lead, iron pills, medicines containing bismuth subsalicylate, or blueberries can also cause black stools. Your caregiver can test black stools to see if blood is present. °It is important that the cause of the bleeding be found. Treatment can then be started, and the problem can be corrected. Rectal bleeding may not be serious, but you should not assume everything is okay until you know the cause. It is very important to follow up with your caregiver or a specialist in gastrointestinal problems. °CAUSES  °Blood in the stools can come from various underlying causes. Often, the cause is not found during your first visit. Testing is often needed to discover the   cause of bleeding in the gastrointestinal tract. Causes range from simple to serious or even life-threatening. Possible causes include: °· Hemorrhoids. These are veins that are full of blood (engorged) in the rectum. They cause pain, inflammation, and may bleed. °· Anal fissures. These are areas of painful tearing which may bleed. They are often  caused by passing hard stool. °· Diverticulosis. These are pouches that form on the colon over time, with age, and may bleed significantly. °· Diverticulitis. This is inflammation in areas with diverticulosis. It can cause pain, fever, and bloody stools, although bleeding is rare. °· Proctitis and colitis. These are inflamed areas of the rectum or colon. They may cause pain, fever, and bloody stools. °· Polyps and cancer. Colon cancer is a leading cause of preventable cancer death. It often starts out as precancerous polyps that can be removed during a colonoscopy, preventing progression into cancer. Sometimes, polyps and cancer may cause rectal bleeding. °· Gastritis and ulcers. Bleeding from the upper gastrointestinal tract (near the stomach) may travel through the intestines and produce black, sometimes tarry, often bad smelling stools. In certain cases, if the bleeding is fast enough, the stools may not be black, but red and the condition may be life-threatening. °SYMPTOMS  °You may have stools that are bright red and bloody, that are normal color with blood on them, or that are dark black and tarry. In some cases, you may only have blood in the toilet bowl. Any of these cases need medical care. You may also have: °· Pain at the anus or anywhere in the rectum. °· Lightheadedness or feeling faint. °· Extreme weakness. °· Nausea or vomiting. °· Fever. °DIAGNOSIS °Your caregiver may use the following methods to find the cause of your bleeding: °· Taking a medical history. Age is important. Older people tend to develop polyps and cancer more often. If there is anal pain and a hard, large stool associated with bleeding, a tear of the anus may be the cause. If blood drips into the toilet after a bowel movement, bleeding hemorrhoids may be the problem. The color and frequency of the bleeding are additional considerations. In most cases, the medical history provides clues, but seldom the final answer. °· A visual and  finger (digital) exam. Your caregiver will inspect the anal area, looking for tears and hemorrhoids. A finger exam can provide information when there is tenderness or a growth inside. In men, the prostate is also examined. °· Endoscopy. Several types of small, long scopes (endoscopes) are used to view the colon. °¨ In the office, your caregiver may use a rigid, or more commonly, a flexible viewing sigmoidoscope. This exam is called flexible sigmoidoscopy. It is performed in 5 to 10 minutes. °¨ A more thorough exam is accomplished with a colonoscope. It allows your caregiver to view the entire 5 to 6 foot long colon. Medicine to help you relax (sedative) is usually given for this exam. Frequently, a bleeding lesion may be present beyond the reach of the sigmoidoscope. So, a colonoscopy may be the best exam to start with. Both exams are usually done on an outpatient basis. This means the patient does not stay overnight in the hospital or surgery center. °¨ An upper endoscopy may be needed to examine your stomach. Sedation is used and a flexible endoscope is put in your mouth, down to your stomach. °· A barium enema X-ray. This is an X-ray exam. It uses liquid barium inserted by enema into the rectum. This test alone may not identify an actual bleeding point. X-rays   highlight abnormal shadows, such as those made by lumps (tumors), diverticuli, or colitis. °TREATMENT  °Treatment depends on the cause of your bleeding.  °· For bleeding from the stomach or colon, the caregiver doing your endoscopy or colonoscopy may be able to stop the bleeding as part of the procedure. °· Inflammation or infection of the colon can be treated with medicines. °· Many rectal problems can be treated with creams, suppositories, or warm baths. °· Surgery is sometimes needed. °· Blood transfusions are sometimes needed if you have lost a lot of blood. °· For any bleeding problem, let your caregiver know if you take aspirin or other blood thinners  regularly. °HOME CARE INSTRUCTIONS  °· Take any medicines exactly as prescribed. °· Keep your stools soft by eating a diet high in fiber. Prunes (1 to 3 a day) work well for many people. °· Drink enough water and fluids to keep your urine clear or pale yellow. °· Take sitz baths if advised. A sitz bath is when you sit in a bathtub with warm water for 10 to 15 minutes to soak, soothe, and cleanse the rectal area. °· If enemas or suppositories are advised, be sure you know how to use them. Tell your caregiver if you have problems with this. °· Monitor your bowel movements to look for signs of improvement or worsening. °SEEK MEDICAL CARE IF:  °· You do not improve in the time expected. °· Your condition worsens after initial improvement. °· You develop any new symptoms. °SEEK IMMEDIATE MEDICAL CARE IF:  °· You develop severe or prolonged rectal bleeding. °· You vomit blood. °· You feel weak or faint. °· You have a fever. °MAKE SURE YOU: °· Understand these instructions. °· Will watch your condition. °· Will get help right away if you are not doing well or get worse. °Document Released: 06/27/2002 Document Revised: 09/29/2011 Document Reviewed: 11/22/2010 °[employer]® Patient Information ©2015 [employer]. This information is not intended to replace advice given to you by your health care provider. Make sure you discuss any questions you have with your health care provider. ° °

## 2014-03-16 NOTE — ED Notes (Signed)
Pt states she has had blood in her stool with upper abdominal pain and cramping all day. Pt also co nausea.

## 2014-03-16 NOTE — ED Provider Notes (Signed)
This chart was scribed for Erin Maw Ward, DO by Marica Otter, ED Scribe. This patient was seen in room APA08/APA08 and the patient's care was started at 5:20 PM.  TIME SEEN: 5:20 PM  CHIEF COMPLAINT:  Chief Complaint  Patient presents with  . Abdominal Pain   HPI:  HPI Comments: [name] is a 33 y.o. female, with a Hx of constipation, who presents to the Emergency Department complaining of intermittent, worsening abd pain with associated nausea and cramping onset 2 weeks ago. Pt also complains of associated diarrhea with bright red blood onset this morning. She describes it as a mucous appearing. Pt denies passing more gas than usual recently, fever, chills, dysuria, or hematuria, vaginal bleeding or discharge, melena. No sick contacts or recent travel. No hospitalization or antibiotic use. No family history of inflammatory bowel disease.  Pt reports she had a colonoscopy 5 years ago because of constipation. Pt reports she uses stool suppositories, miramax and Exlax to help with her constipation. Pt notes, however, that she has not used laxatives or suppositories in one week.   ROS: See HPI Constitutional: no fever  Eyes: no drainage  ENT: no runny nose   Cardiovascular:  no chest pain  Resp: no SOB  GI: no vomiting GU: no dysuria Integumentary: no rash  Allergy: no hives  Musculoskeletal: no leg swelling  Neurological: no slurred speech ROS otherwise negative  PAST MEDICAL HISTORY/PAST SURGICAL HISTORY:  Past Medical History  Diagnosis Date  . Constipation     MEDICATIONS:  Prior to Admission medications   Medication Sig Start Date End Date Taking? Authorizing Provider  BEE POLLEN PO Take by mouth.    Historical Provider, MD  cetirizine (ZYRTEC) 10 MG tablet Take 10 mg by mouth daily.    Historical Provider, MD  ondansetron (ZOFRAN) 4 MG tablet Take 1 tablet (4 mg total) by mouth every 6 (six) hours. 02/16/14   Ethelda Chick, MD    ALLERGIES:  No Known  Allergies  SOCIAL HISTORY:  History  Substance Use Topics  . Smoking status: Current Every Day Smoker -- 0.50 packs/day    Types: Cigarettes  . Smokeless tobacco: Not on file  . Alcohol Use: Yes     Comment: occasional    FAMILY HISTORY: History reviewed. No pertinent family history.  EXAM: Triage Vitals: BP 128/88  Pulse 88  Temp(Src) 99.2 F (37.3 C) (Oral)  Resp 19  Ht  (1.626 m)  Wt 154 lb (69.854 kg)  BMI 26.42 kg/m2  SpO2 100%  CONSTITUTIONAL: Alert and oriented and responds appropriately to questions. Well-appearing; well-nourished HEAD: Normocephalic EYES: Conjunctivae clear, PERRL ENT: normal nose; no rhinorrhea; moist mucous membranes; pharynx without lesions noted NECK: Supple, no meningismus, no LAD  CARD: RRR; S1 and S2 appreciated; no murmurs, no clicks, no rubs, no gallops RESP: Normal chest excursion without splinting or tachypnea; breath sounds clear and equal bilaterally; no wheezes, no rhonchi, no rales,  ABD/GI: Normal bowel sounds; non-distended; soft, tender in the epigastric and LLQ, no rebound, mild voluntary guarding. No peritoneal signs BACK:  The back appears normal and is non-tender to palpation, there is no CVA tenderness EXT: Normal ROM in all joints; non-tender to palpation; no edema; normal capillary refill; no cyanosis    SKIN: Normal color for age and race; warm NEURO: Moves all extremities equally PSYCH: The patient's mood and manner are appropriate. Grooming and personal hygiene are appropriate. RECTAL EXAM: Chaperone was present. Patient with epigastric pain and LLQ  pain. There are no external fissures noted. No induration of the skin or swelling. External non-thrombosed hemorrhoids seen. Patient able to tolerate examination. I was able to feel the first 2-3cm of the rectum digitally without gross abnormality. There is no gross blood.  NO signs of perirectal abscess.No melena.    MEDICAL DECISION MAKING: Patient here with nausea,  left lower quadrant epigastric abdominal pain and bloody diarrhea. She is hemodynamically stable. Will obtain abdominal labs, urine and a CT of her abdomen and pelvis. Differential diagnosis includes peptic ulcer disease, colitis, pancreatitis, diverticulitis. She is status post appendectomy and partial hysterectomy. We'll give pain and nausea medication and IV fluid. Her rectal exam showed no gross blood or melena. She is guaiac negative.  ED PROGRESS: Patient has been able to have a bowel movement here with no blood in her stool. Her labs are unremarkable. Her CT scan is completely normal. Discussed with her that I recommend she followup with her gastroenterologist. She has an appointment scheduled tomorrow with Dr. Elyn Peers. Discussed supportive care instructions and return precautions. We'll discharge with prescription for Vicodin and Zofran. Have discussed with her the narcotics or may make her more constipated and she should continue her bowel regimen while taking his medications. She verbalized understanding and is comfortable with plan.  I personally performed the services described in this documentation, which was scribed in my presence. The recorded information has been reviewed and is accurate.     Erin Maw Ward, DO 03/16/14 2112

## 2014-03-17 LAB — POC OCCULT BLOOD, ED: FECAL OCCULT BLD: NEGATIVE

## 2014-03-24 ENCOUNTER — Encounter: Payer: Self-pay | Admitting: Gastroenterology

## 2014-03-24 ENCOUNTER — Encounter (INDEPENDENT_AMBULATORY_CARE_PROVIDER_SITE_OTHER): Payer: Self-pay

## 2014-03-24 ENCOUNTER — Ambulatory Visit (INDEPENDENT_AMBULATORY_CARE_PROVIDER_SITE_OTHER): Payer: 59 | Admitting: Gastroenterology

## 2014-03-24 ENCOUNTER — Other Ambulatory Visit: Payer: Self-pay | Admitting: Internal Medicine

## 2014-03-24 VITALS — BP 109/68 | HR 69 | Temp 97.8°F | Ht 64.0 in | Wt 157.4 lb

## 2014-03-24 DIAGNOSIS — K3189 Other diseases of stomach and duodenum: Secondary | ICD-10-CM

## 2014-03-24 DIAGNOSIS — K5909 Other constipation: Secondary | ICD-10-CM

## 2014-03-24 DIAGNOSIS — R1013 Epigastric pain: Secondary | ICD-10-CM

## 2014-03-24 MED ORDER — HYDROCORTISONE ACETATE 25 MG RE SUPP: 25.0000 mg | Freq: Two times a day (BID) | RECTAL | Status: DC

## 2014-03-24 MED ORDER — LINACLOTIDE 290 MCG PO CAPS: 290.0000 ug | ORAL_CAPSULE | Freq: Every day | ORAL | Status: DC

## 2014-03-24 MED ORDER — PANTOPRAZOLE SODIUM 40 MG PO TBEC: 40.0000 mg | DELAYED_RELEASE_TABLET | Freq: Every day | ORAL | Status: DC

## 2014-03-24 NOTE — Progress Notes (Signed)
Referring Provider: Ellin Saba, PA Primary Care Physician:  Ellin Saba, PA Primary Gastroenterologist:  Dr. Jena Gauss   Chief Complaint  Patient presents with  . Abdominal Pain  . Constipation    HPI:  [name] presents today at the request of her PCP secondary to abdominal pain. Notes constant feeling sick on stomach. Notes mainly epigastric/LUQ pain, sore. Present for at least a month. Constant nausea. Staying on Phenergan. No vomiting. Possible black stool after being very constipated. No dysphagia. Occasional GERD. Had been taking Ibuprofen but now changed to Excedrin. Nexium for about a month.    Had rectal bleeding recently. Miralax daily. Still with constipation. Last colonoscopy in 2011 with friable anal canal, scattered left-sided diverticula.      Past Medical History  Diagnosis Date  . Constipation     Past Surgical History  Procedure Laterality Date  . Abdominal hysterectomy    . Appendectomy    . Colonoscopy  08/16/2009    Dr. Rourk:friable anal canal otherwise normal/scattered left-sided diverticula    Current Outpatient Prescriptions  Medication Sig Dispense Refill  . BEE POLLEN PO Take 550 mg by mouth daily.       . cetirizine (ZYRTEC) 10 MG tablet Take 10 mg by mouth daily.      Marland Kitchen esomeprazole (NEXIUM) 40 MG capsule Take 40 mg by mouth daily at 12 noon.      Marland Kitchen HYDROcodone-acetaminophen (NORCO/VICODIN) 5-325 MG per tablet Take 1 tablet by mouth every 4 (four) hours as needed.  15 tablet  0  . ondansetron (ZOFRAN) 4 MG tablet Take 1 tablet (4 mg total) by mouth every 8 (eight) hours as needed for nausea or vomiting.  20 tablet  0  . polyethylene glycol powder (GLYCOLAX/MIRALAX) powder as needed.       . hydrocortisone (ANUSOL-HC) 25 MG suppository Place 1 suppository (25 mg total) rectally every 12 (twelve) hours.  12 suppository  1  . Linaclotide (LINZESS) 290 MCG CAPS capsule Take 1 capsule (290 mcg total) by mouth daily.  30 capsule  5  .  pantoprazole (PROTONIX) 40 MG tablet Take 1 tablet (40 mg total) by mouth daily. Take 30 minutes before breakfast  30 tablet  3   No current facility-administered medications for this visit.    Allergies as of 03/24/2014  . (No Known Allergies)    Family History  Problem Relation Age of Onset  . Colon cancer Neg Hx     History   Social History  . Marital Status: Married    Spouse Name: N/A    Number of Children: N/A  . Years of Education: N/A   Occupational History  . Not on file.   Social History Main Topics  . Smoking status: Current Every Day Smoker -- 0.50 packs/day    Types: Cigarettes  . Smokeless tobacco: Not on file  . Alcohol Use: Yes     Comment: occasional  . Drug Use: No     Comment: in past  . Sexual Activity: Not on file   Other Topics Concern  . Not on file   Social History Narrative  . No narrative on file    Review of Systems: As mentioned in HPI  Physical Exam: BP 109/68  Pulse 69  Temp(Src) 97.8 F (36.6 C) (Oral)  Ht  (1.626 m)  Wt 157 lb 6.4 oz (71.396 kg)  BMI 27.00 kg/m2 General:   Alert and oriented. Well-developed, well-nourished, pleasant and cooperative. Head:  Normocephalic and  atraumatic. Eyes:  Conjunctiva pink, sclera clear, no icterus.   Conjunctiva pink. Ears:  Normal auditory acuity. Nose:  No deformity, discharge,  or lesions. Mouth:  No deformity or lesions, mucosa pink and moist.  Lungs:  Clear to auscultation bilaterally, without wheezing, rales, or rhonchi.  Heart:  S1, S2 present without murmurs noted.  Abdomen:  +BS, soft, TTP epigastric and LUQ and non-distended. Without mass or HSM. No rebound or guarding. No hernias noted. Rectal:  Deferred  Msk:  Symmetrical without gross deformities. Normal posture. Extremities:  Without clubbing or edema. Neurologic:  Alert and  oriented x4;  grossly normal neurologically. Skin:  Intact, warm and dry without significant lesions or rash Psych:  Alert and cooperative.  Normal mood and affect.  Outside labs July 2015: normal CBC, normal lipase.   CT Aug 2015 normal

## 2014-03-24 NOTE — Patient Instructions (Signed)
Start taking Linzess 1 capsule each morning, 30 minutes before breakfast. I have provided a voucher for a month supply free. This is for constipation.  Stop Nexium. Start taking Protonix once each morning, 30 minutes before breakfast.   Avoid Ibuprofen, Aleve, Advil, Motrin. Avoid Excedrin. Only use tylenol products.  I have also sent in suppositories for your rectum to take twice a day for 7 days.   We have scheduled you for an upper endoscopy with Dr. Jena Gauss in the near future.

## 2014-03-27 ENCOUNTER — Encounter: Payer: Self-pay | Admitting: Gastroenterology

## 2014-03-27 DIAGNOSIS — R1013 Epigastric pain: Secondary | ICD-10-CM | POA: Insufficient documentation

## 2014-03-27 NOTE — Assessment & Plan Note (Addendum)
32 year 43old female with new-onset dyspepsia in the setting of NSAIDs and excedrin recently. Intermittent GERD without improvement on Nexium for 1 month. Gallbladder remains in situ. Symptoms constant. Recommend EGD initially to assess for gastritis, PUD; may need [place] +/- HIDA if negative EGD.   Proceed with upper endoscopy in the near future with Dr. Jena Gauss. The risks, benefits, and alternatives have been discussed in detail with patient. They have stated understanding and desire to proceed.  Change from Nexium to Protonix.  PHENERGAN 25 MG IV ON CALL prior to procedure

## 2014-03-27 NOTE — Assessment & Plan Note (Signed)
Failing OTC agents. Trial Linzess 290 mcg daily. Anusol BID X 7 days due to low-volume hematochezia. Last colonoscopy in 2011 with friable anal canal, likely source of hematochezia. Consider updated colonoscopy if persistent.

## 2014-03-28 ENCOUNTER — Ambulatory Visit (HOSPITAL_COMMUNITY)
Admission: RE | Admit: 2014-03-28 | Discharge: 2014-03-28 | Disposition: A | Discharge status: Home / Self Care | Payer: 59 | Source: Ambulatory Visit | Attending: Internal Medicine | Admitting: Internal Medicine

## 2014-03-28 ENCOUNTER — Encounter (HOSPITAL_COMMUNITY)
Admission: RE | Disposition: A | Discharge status: Home / Self Care | Payer: Self-pay | Source: Ambulatory Visit | Attending: Internal Medicine

## 2014-03-28 ENCOUNTER — Encounter (HOSPITAL_COMMUNITY): Payer: Self-pay | Admitting: *Deleted

## 2014-03-28 DIAGNOSIS — K3189 Other diseases of stomach and duodenum: Secondary | ICD-10-CM | POA: Diagnosis present

## 2014-03-28 DIAGNOSIS — Z79899 Other long term (current) drug therapy: Secondary | ICD-10-CM | POA: Diagnosis not present

## 2014-03-28 DIAGNOSIS — K259 Gastric ulcer, unspecified as acute or chronic, without hemorrhage or perforation: Secondary | ICD-10-CM | POA: Insufficient documentation

## 2014-03-28 DIAGNOSIS — K2940 Chronic atrophic gastritis without bleeding: Secondary | ICD-10-CM | POA: Diagnosis not present

## 2014-03-28 DIAGNOSIS — R1012 Left upper quadrant pain: Secondary | ICD-10-CM | POA: Diagnosis not present

## 2014-03-28 DIAGNOSIS — R1013 Epigastric pain: Secondary | ICD-10-CM

## 2014-03-28 DIAGNOSIS — K279 Peptic ulcer, site unspecified, unspecified as acute or chronic, without hemorrhage or perforation: Secondary | ICD-10-CM

## 2014-03-28 HISTORY — PX: ESOPHAGOGASTRODUODENOSCOPY: SHX5428

## 2014-03-28 SURGERY — EGD (ESOPHAGOGASTRODUODENOSCOPY)
Anesthesia: Moderate Sedation

## 2014-03-28 MED ORDER — LIDOCAINE VISCOUS 2 % MT SOLN
OROMUCOSAL | Status: AC
  Filled 2014-03-28: qty 15

## 2014-03-28 MED ORDER — LIDOCAINE VISCOUS 2 % MT SOLN
OROMUCOSAL | Status: DC | PRN
  Administered 2014-03-28: 1 via OROMUCOSAL

## 2014-03-28 MED ORDER — SODIUM CHLORIDE 0.9 % IJ SOLN
INTRAMUSCULAR | Status: AC
  Filled 2014-03-28: qty 10

## 2014-03-28 MED ORDER — ONDANSETRON HCL 4 MG/2ML IJ SOLN
INTRAMUSCULAR | Status: AC
  Filled 2014-03-28: qty 2

## 2014-03-28 MED ORDER — MEPERIDINE HCL 100 MG/ML IJ SOLN
INTRAMUSCULAR | Status: DC | PRN
  Administered 2014-03-28 (×2): 50 mg via INTRAVENOUS

## 2014-03-28 MED ORDER — STERILE WATER FOR IRRIGATION IR SOLN
Status: DC | PRN
  Administered 2014-03-28: 15:00:00

## 2014-03-28 MED ORDER — PROMETHAZINE HCL 25 MG/ML IJ SOLN
25.0000 mg | Freq: Once | INTRAMUSCULAR | Status: AC
  Administered 2014-03-28: 25 mg via INTRAVENOUS

## 2014-03-28 MED ORDER — SODIUM CHLORIDE 0.9 % IV SOLN
INTRAVENOUS | Status: DC
  Administered 2014-03-28: 15:00:00 via INTRAVENOUS

## 2014-03-28 MED ORDER — ONDANSETRON HCL 4 MG/2ML IJ SOLN
INTRAMUSCULAR | Status: DC | PRN
  Administered 2014-03-28: 4 mg via INTRAVENOUS

## 2014-03-28 MED ORDER — MIDAZOLAM HCL 5 MG/5ML IJ SOLN
INTRAMUSCULAR | Status: DC | PRN
  Administered 2014-03-28 (×2): 2 mg via INTRAVENOUS

## 2014-03-28 MED ORDER — MIDAZOLAM HCL 5 MG/5ML IJ SOLN
INTRAMUSCULAR | Status: AC
  Filled 2014-03-28: qty 10

## 2014-03-28 MED ORDER — MEPERIDINE HCL 100 MG/ML IJ SOLN
INTRAMUSCULAR | Status: AC
  Filled 2014-03-28: qty 2

## 2014-03-28 MED ORDER — PROMETHAZINE HCL 25 MG/ML IJ SOLN
INTRAMUSCULAR | Status: AC
  Filled 2014-03-28: qty 1

## 2014-03-28 NOTE — Op Note (Signed)
[facility] [address] [place], [address]   ENDOSCOPY PROCEDURE REPORT  PATIENT: [name], [name]  MR#: [id number] BIRTHDATE: May 26, 1981 , 32  yrs. old GENDER: Female ENDOSCOPIST: R.  Roetta Sessions, MD FACP FACG REFERRED BY:  Evelena Peat, M.D. PROCEDURE DATE:  03/28/2014 PROCEDURE:     EGD with gastric biopsy  INDICATIONS:      Dyspepsia; NSAID use  INFORMED CONSENT:   The risks, benefits, limitations, alternatives and imponderables have been discussed.  The potential for biopsy, esophogeal dilation, etc. have also been reviewed.  Questions have been answered.  All parties agreeable.  Please see the history and physical in the medical record for more information.  MEDICATIONS:     Versed 4 mg IV and Demerol 100 mg IV in divided doses. Phenergan 25 mg IV. Xylocaine gel. Zofran 4 mg IV  DESCRIPTION OF PROCEDURE:   The     endoscope was introduced through the mouth and advanced to the second portion of the duodenum without difficulty or limitations.  The mucosal surfaces were surveyed very carefully during advancement of the scope and upon withdrawal.  Retroflexion view of the proximal stomach and esophagogastric junction was performed.      FINDINGS:  Normal esophagus. Stomach empty. Multiple diffuse gastric erosions with a 7 mm stellate shaped prepyloric ulcer. There do not appear to be an infiltrating process. Pylorus patent. Examination of bulb and second portion revealed no abnormalities.  THERAPEUTIC / DIAGNOSTIC MANEUVERS PERFORMED:  biopsies of the ulcer/abnormal gastric mucosa taken for histologic study.   COMPLICATIONS:  None  IMPRESSION:    gastric ulcer. Gastric erosions-status post gastric biopsy  RECOMMENDATIONS:   Avoid all nonsteroidals including aspirin. Increase Protonix to 40 mg twice daily x3 months. Carafate suspension 4 times a day x2 weeks  Followup on pathology. If abdominal discomfort does not improve significantly  within the next couple of weeks, would consider gallbladder evaluation.  Office visit with [place] in 6 weeks.    _______________________________ R. Roetta Sessions, MD FACP [facility] eSigned:  R. Roetta Sessions, MD FACP [facility] 03/28/2014 3:52 PM     CC:  PATIENT NAME:  Erin, Brooks MR#: [id number]

## 2014-03-28 NOTE — Discharge Instructions (Addendum)
EGD Discharge instructions Please read the instructions outlined below and refer to this sheet in the next few weeks. These discharge instructions provide you with general information on caring for yourself after you leave the hospital. Your doctor may also give you specific instructions. While your treatment has been planned according to the most current medical practices available, unavoidable complications occasionally occur. If you have any problems or questions after discharge, please call your doctor. ACTIVITY  You may resume your regular activity but move at a slower pace for the next 24 hours.   Take frequent rest periods for the next 24 hours.   Walking will help expel (get rid of) the air and reduce the bloated feeling in your abdomen.   No driving for 24 hours (because of the anesthesia (medicine) used during the test).   You may shower.   Do not sign any important legal documents or operate any machinery for 24 hours (because of the anesthesia used during the test).  NUTRITION  Drink plenty of fluids.   You may resume your normal diet.   Begin with a light meal and progress to your normal diet.   Avoid alcoholic beverages for 24 hours or as instructed by your caregiver.  MEDICATIONS  You may resume your normal medications unless your caregiver tells you otherwise.  WHAT YOU CAN EXPECT TODAY  You may experience abdominal discomfort such as a feeling of fullness or gas pains.  FOLLOW-UP  Your doctor will discuss the results of your test with you.  SEEK IMMEDIATE MEDICAL ATTENTION IF ANY OF THE FOLLOWING OCCUR:  Excessive nausea (feeling sick to your stomach) and/or vomiting.   Severe abdominal pain and distention (swelling).   Trouble swallowing.   Temperature over 101 F (37.8 C).   Rectal bleeding or vomiting of blood.     Peptic ulcer disease information provided  Increase Protonix to 40 mg twice daily x3 Monts  Carafate suspension 1 g 4 times a day  x2 weeks  If your abdominal pain is not considerably improved in the next 1-2 weeks, you will need evaluation of your gallbladder  Continue to avoid all forms of aspirin nonsteroidal agents  Further recommendations to follow pending review of pathology report  Peptic Ulcer A peptic ulcer is a sore in the lining of your esophagus (esophageal ulcer), stomach (gastric ulcer), or in the first part of your small intestine (duodenal ulcer). The ulcer causes erosion into the deeper tissue. CAUSES  Normally, the lining of the stomach and the small intestine protects itself from the acid that digests food. The protective lining can be damaged by:  An infection caused by a bacterium called Helicobacter pylori (H. pylori).  Regular use of nonsteroidal anti-inflammatory drugs (NSAIDs), such as ibuprofen or aspirin.  Smoking tobacco. Other risk factors include being older than 50, drinking alcohol excessively, and having a family history of ulcer disease.  SYMPTOMS   Burning pain or gnawing in the area between the chest and the belly button.  Heartburn.  Nausea and vomiting.  Bloating. The pain can be worse on an empty stomach and at night. If the ulcer results in bleeding, it can cause:  Black, tarry stools.  Vomiting of bright red blood.  Vomiting of coffee-ground-looking materials. DIAGNOSIS  A diagnosis is usually made based upon your history and an exam. Other tests and procedures may be performed to find the cause of the ulcer. Finding a cause will help determine the best treatment. Tests and procedures may include:  Blood tests, stool tests, or breath tests to check for the bacterium H. pylori.  An upper gastrointestinal (GI) series of the esophagus, stomach, and small intestine.  An endoscopy to examine the esophagus, stomach, and small intestine.  A biopsy. TREATMENT  Treatment may include:  Eliminating the cause of the ulcer, such as smoking, NSAIDs, or  alcohol.  Medicines to reduce the amount of acid in your digestive tract.  Antibiotic medicines if the ulcer is caused by the H. pylori bacterium.  An upper endoscopy to treat a bleeding ulcer.  Surgery if the bleeding is severe or if the ulcer created a hole somewhere in the digestive system. HOME CARE INSTRUCTIONS   Avoid tobacco, alcohol, and caffeine. Smoking can increase the acid in the stomach, and continued smoking will impair the healing of ulcers.  Avoid foods and drinks that seem to cause discomfort or aggravate your ulcer.  Only take medicines as directed by your caregiver. Do not substitute over-the-counter medicines for prescription medicines without talking to your caregiver.  Keep any follow-up appointments and tests as directed. SEEK MEDICAL CARE IF:   Your do not improve within 7 days of starting treatment.  You have ongoing indigestion or heartburn. SEEK IMMEDIATE MEDICAL CARE IF:   You have sudden, sharp, or persistent abdominal pain.  You have bloody or dark black, tarry stools.  You vomit blood or vomit that looks like coffee grounds.  You become light-headed, weak, or feel faint.  You become sweaty or clammy. MAKE SURE YOU:   Understand these instructions.  Will watch your condition.  Will get help right away if you are not doing well or get worse. Document Released: 07/04/2000 Document Revised: 11/21/2013 Document Reviewed: 02/04/2012 [facility] Patient Information 2015 [place], [place]. This information is not intended to replace advice given to you by your health care provider. Make sure you discuss any questions you have with your health care provider.

## 2014-03-28 NOTE — Progress Notes (Signed)
Cc to pcp °

## 2014-03-28 NOTE — H&P (View-Only) (Signed)
Referring Provider: Ellin Saba, PA Primary Care Physician:  Ellin Saba, PA Primary Gastroenterologist:  Dr. Jena Gauss   Chief Complaint  Patient presents with  . Abdominal Pain  . Constipation    HPI:  [name] presents today at the request of her PCP secondary to abdominal pain. Notes constant feeling sick on stomach. Notes mainly epigastric/LUQ pain, sore. Present for at least a month. Constant nausea. Staying on Phenergan. No vomiting. Possible black stool after being very constipated. No dysphagia. Occasional GERD. Had been taking Ibuprofen but now changed to Excedrin. Nexium for about a month.    Had rectal bleeding recently. Miralax daily. Still with constipation. Last colonoscopy in 2011 with friable anal canal, scattered left-sided diverticula.      Past Medical History  Diagnosis Date  . Constipation     Past Surgical History  Procedure Laterality Date  . Abdominal hysterectomy    . Appendectomy    . Colonoscopy  08/16/2009    Dr. Rourk:friable anal canal otherwise normal/scattered left-sided diverticula    Current Outpatient Prescriptions  Medication Sig Dispense Refill  . BEE POLLEN PO Take 550 mg by mouth daily.       . cetirizine (ZYRTEC) 10 MG tablet Take 10 mg by mouth daily.      Marland Kitchen esomeprazole (NEXIUM) 40 MG capsule Take 40 mg by mouth daily at 12 noon.      Marland Kitchen HYDROcodone-acetaminophen (NORCO/VICODIN) 5-325 MG per tablet Take 1 tablet by mouth every 4 (four) hours as needed.  15 tablet  0  . ondansetron (ZOFRAN) 4 MG tablet Take 1 tablet (4 mg total) by mouth every 8 (eight) hours as needed for nausea or vomiting.  20 tablet  0  . polyethylene glycol powder (GLYCOLAX/MIRALAX) powder as needed.       . hydrocortisone (ANUSOL-HC) 25 MG suppository Place 1 suppository (25 mg total) rectally every 12 (twelve) hours.  12 suppository  1  . Linaclotide (LINZESS) 290 MCG CAPS capsule Take 1 capsule (290 mcg total) by mouth daily.  30 capsule  5  .  pantoprazole (PROTONIX) 40 MG tablet Take 1 tablet (40 mg total) by mouth daily. Take 30 minutes before breakfast  30 tablet  3   No current facility-administered medications for this visit.    Allergies as of 03/24/2014  . (No Known Allergies)    Family History  Problem Relation Age of Onset  . Colon cancer Neg Hx     History   Social History  . Marital Status: Married    Spouse Name: N/A    Number of Children: N/A  . Years of Education: N/A   Occupational History  . Not on file.   Social History Main Topics  . Smoking status: Current Every Day Smoker -- 0.50 packs/day    Types: Cigarettes  . Smokeless tobacco: Not on file  . Alcohol Use: Yes     Comment: occasional  . Drug Use: No     Comment: in past  . Sexual Activity: Not on file   Other Topics Concern  . Not on file   Social History Narrative  . No narrative on file    Review of Systems: As mentioned in HPI  Physical Exam: BP 109/68  Pulse 69  Temp(Src) 97.8 F (36.6 C) (Oral)  Ht  (1.626 m)  Wt 157 lb 6.4 oz (71.396 kg)  BMI 27.00 kg/m2 General:   Alert and oriented. Well-developed, well-nourished, pleasant and cooperative. Head:  Normocephalic and  atraumatic. Eyes:  Conjunctiva pink, sclera clear, no icterus.   Conjunctiva pink. Ears:  Normal auditory acuity. Nose:  No deformity, discharge,  or lesions. Mouth:  No deformity or lesions, mucosa pink and moist.  Lungs:  Clear to auscultation bilaterally, without wheezing, rales, or rhonchi.  Heart:  S1, S2 present without murmurs noted.  Abdomen:  +BS, soft, TTP epigastric and LUQ and non-distended. Without mass or HSM. No rebound or guarding. No hernias noted. Rectal:  Deferred  Msk:  Symmetrical without gross deformities. Normal posture. Extremities:  Without clubbing or edema. Neurologic:  Alert and  oriented x4;  grossly normal neurologically. Skin:  Intact, warm and dry without significant lesions or rash Psych:  Alert and cooperative.  Normal mood and affect.  Outside labs July 2015: normal CBC, normal lipase.   CT Aug 2015 normal 

## 2014-03-28 NOTE — Interval H&P Note (Signed)
History and Physical Interval Note:  03/28/2014 3:16 PM  [name]  has presented today for surgery, with the diagnosis of DYSPEPSIA  The various methods of treatment have been discussed with the patient and family. After consideration of risks, benefits and other options for treatment, the patient has consented to  Procedure(s) with comments: ESOPHAGOGASTRODUODENOSCOPY (EGD) (N/A) - 315 as a surgical intervention .  The patient's history has been reviewed, patient examined, no change in status, stable for surgery.  I have reviewed the patient's chart and labs.  Questions were answered to the patient's satisfaction.     [name]  No change. EGD per plan.The risks, benefits, limitations, alternatives and imponderables have been reviewed with the patient. Potential for esophageal dilation, biopsy, etc. have also been reviewed.  Questions have been answered. All parties agreeable.

## 2014-03-31 ENCOUNTER — Encounter: Payer: Self-pay | Admitting: Internal Medicine

## 2014-03-31 ENCOUNTER — Encounter (HOSPITAL_COMMUNITY): Payer: Self-pay | Admitting: Internal Medicine

## 2014-04-03 ENCOUNTER — Telehealth: Payer: Self-pay

## 2014-04-03 NOTE — Telephone Encounter (Signed)
Letter from: Corbin Ade  Reason for Letter: Results Review  Send letter to patient.  Send copy of letter with path to referring provider and PCP.  These office visit with extender in 3 months if not all rescheduled

## 2014-04-03 NOTE — Telephone Encounter (Signed)
Mailed letter to pt

## 2014-04-04 ENCOUNTER — Encounter: Payer: Self-pay | Admitting: Internal Medicine

## 2014-04-04 NOTE — Telephone Encounter (Signed)
APPT MADE AND LETTER MAILED.  °

## 2014-05-02 ENCOUNTER — Telehealth: Payer: Self-pay | Admitting: Internal Medicine

## 2014-05-02 NOTE — Telephone Encounter (Signed)
PATIENT CALLED STATING HER LEFT SIDE WAS HURTING AT RIB CAGE  PLEASE ADVISE, HAS NOT GONE TO ER.  ROURK TOLD HER SHE MAY HAVE TO HAVE GALLBLADDER CHECKED.  [phone]  ALSO BURNING IN THROAT

## 2014-05-03 ENCOUNTER — Other Ambulatory Visit: Payer: Self-pay

## 2014-05-03 DIAGNOSIS — R1012 Left upper quadrant pain: Secondary | ICD-10-CM

## 2014-05-03 MED ORDER — SUCRALFATE 1 GM/10ML PO SUSP: 1.0000 g | Freq: Four times a day (QID) | ORAL | Status: DC

## 2014-05-03 NOTE — Telephone Encounter (Signed)
She definitely needs BID PPI therapy due to PUD for 3 months. We can order an US to check her gallbladder. I sent in another course of carafate for her.

## 2014-05-03 NOTE — Addendum Note (Signed)
Addended by: Nira RetortSAMS, ANNA W on: 05/03/2014 10:54 AM   Modules accepted: Orders

## 2014-05-03 NOTE — Telephone Encounter (Signed)
Agree 

## 2014-05-03 NOTE — Telephone Encounter (Signed)
Pt is schedule for her US on  05/08/14 @ 8:45 and Pt is aware of appointment and time.

## 2014-05-03 NOTE — Telephone Encounter (Signed)
Spoke with the pt- she is having some pain in her abd and burning in her throat. It comes and goes. She said her throat feels like its on fire. She is taking protonix bid and its not helping. She wanted to know if she needs to have her gallbladder checked, she said RMR said she might need it checked.

## 2014-05-08 ENCOUNTER — Ambulatory Visit (HOSPITAL_COMMUNITY)
Admission: RE | Admit: 2014-05-08 | Discharge: 2014-05-08 | Disposition: A | Discharge status: Home / Self Care | Payer: 59 | Source: Ambulatory Visit | Attending: Gastroenterology | Admitting: Gastroenterology

## 2014-05-08 DIAGNOSIS — R110 Nausea: Secondary | ICD-10-CM | POA: Insufficient documentation

## 2014-05-08 DIAGNOSIS — R1012 Left upper quadrant pain: Secondary | ICD-10-CM | POA: Diagnosis present

## 2014-05-10 ENCOUNTER — Telehealth: Payer: Self-pay | Admitting: Internal Medicine

## 2014-05-10 NOTE — Telephone Encounter (Signed)
PATIENT CALLED INQUIRING ABOUT ULTRASOUND RESULTS  °

## 2014-05-10 NOTE — Telephone Encounter (Signed)
Routing to AS 

## 2014-05-11 ENCOUNTER — Other Ambulatory Visit: Payer: Self-pay

## 2014-05-11 DIAGNOSIS — R1011 Right upper quadrant pain: Secondary | ICD-10-CM

## 2014-05-11 NOTE — Telephone Encounter (Signed)
See result note.  

## 2014-05-11 NOTE — Progress Notes (Signed)
Quick Note:  No gallstones. CBD 7.5 mm. No evidence of CBD stone.  Proceed with HIDA. ______

## 2014-05-12 NOTE — Telephone Encounter (Signed)
Ginger spoke with the pt. 

## 2014-05-15 ENCOUNTER — Encounter (HOSPITAL_COMMUNITY): Payer: Self-pay

## 2014-05-15 ENCOUNTER — Encounter (HOSPITAL_COMMUNITY)
Admission: RE | Admit: 2014-05-15 | Discharge: 2014-05-15 | Disposition: A | Discharge status: Home / Self Care | Payer: 59 | Source: Ambulatory Visit | Attending: Gastroenterology | Admitting: Gastroenterology

## 2014-05-15 DIAGNOSIS — R1011 Right upper quadrant pain: Secondary | ICD-10-CM | POA: Diagnosis present

## 2014-05-15 MED ORDER — SODIUM CHLORIDE 0.9 % IJ SOLN
INTRAMUSCULAR | Status: AC
  Filled 2014-05-15: qty 30

## 2014-05-15 MED ORDER — TECHNETIUM TC 99M MEBROFENIN IV KIT
5.0000 | PACK | Freq: Once | INTRAVENOUS | Status: AC | PRN
  Administered 2014-05-15: 5 via INTRAVENOUS

## 2014-05-15 MED ORDER — SINCALIDE 5 MCG IJ SOLR
INTRAMUSCULAR | Status: AC
  Administered 2014-05-15: 1.45 ug via INTRAVENOUS
  Filled 2014-05-15: qty 5

## 2014-05-15 MED ORDER — STERILE WATER FOR INJECTION IJ SOLN
INTRAMUSCULAR | Status: AC
  Administered 2014-05-15: 5 mL via INTRAVENOUS
  Filled 2014-05-15: qty 10

## 2014-05-23 NOTE — Progress Notes (Signed)
Quick Note:  HIDA scan normal. She likely is having discomfort secondary to known PUD. Should be feeling better now, as she has been on PPI BID for awhile. Needs to be on PPI BID for total of 3 months. ______

## 2014-06-14 ENCOUNTER — Telehealth: Payer: Self-pay | Admitting: *Deleted

## 2014-06-14 NOTE — Telephone Encounter (Signed)
PA   APPROVED LINZESS 290 MCG CAPSULE THRU 09/29/14

## 2014-07-04 ENCOUNTER — Ambulatory Visit: Payer: 59 | Admitting: Gastroenterology

## 2014-10-03 ENCOUNTER — Other Ambulatory Visit: Payer: Self-pay

## 2014-10-03 MED ORDER — PANTOPRAZOLE SODIUM 40 MG PO TBEC: 40.0000 mg | DELAYED_RELEASE_TABLET | Freq: Every day | ORAL | Status: DC

## 2014-11-01 ENCOUNTER — Encounter: Payer: Self-pay | Admitting: Nurse Practitioner

## 2014-11-01 ENCOUNTER — Ambulatory Visit (INDEPENDENT_AMBULATORY_CARE_PROVIDER_SITE_OTHER): Payer: 59 | Admitting: Nurse Practitioner

## 2014-11-01 VITALS — BP 117/67 | HR 75 | Temp 98.2°F | Ht 64.0 in | Wt 168.6 lb

## 2014-11-01 DIAGNOSIS — K259 Gastric ulcer, unspecified as acute or chronic, without hemorrhage or perforation: Secondary | ICD-10-CM

## 2014-11-01 DIAGNOSIS — K5909 Other constipation: Secondary | ICD-10-CM | POA: Diagnosis not present

## 2014-11-01 DIAGNOSIS — K219 Gastro-esophageal reflux disease without esophagitis: Secondary | ICD-10-CM

## 2014-11-01 MED ORDER — LINACLOTIDE 290 MCG PO CAPS: 290.0000 ug | ORAL_CAPSULE | Freq: Every day | ORAL | Status: DC

## 2014-11-01 NOTE — Assessment & Plan Note (Signed)
Patient with a long-standing history of constipation, approximately 16 years, for which she had failed multiple medications including MiraLAX, stool softeners, suppositories, stimulant laxatives, and others. Was trialed on Linzess 290 g once a day and is since had a remarkable improvement in her symptoms. She states "I have never been this regular and over 16 years." States is completely changed her quality of life. She is on her last mother Linzess currently is requesting a refill at this time. Linzess refill sent to the pharmacy. We'll handle prior if requested by insurance company. Return for follow-up as needed, or sooner for any changes or worsening in symptoms.

## 2014-11-01 NOTE — Assessment & Plan Note (Signed)
34 year old female with a history of gastric ulcer on endoscopy. Her Protonix was increased to 40 mg twice a day for 3 months. Her symptoms subsided approximately 1 month after beginning this increased dose therapy. Symptoms are now well controlled. However her PCP attempted to reduce her Protonix to once a day and her symptoms returned at which point she began taking the medication twice a day again. Is seeing her PCP next week and we'll address this with him. No red flag or warning symptoms to indicate worsening problem.

## 2014-11-01 NOTE — Progress Notes (Signed)
Referring Provider: Ellin Saba, PA Primary Care Physician:  Ellin Saba, PA Primary GI: Dr. Jena Gauss  Chief Complaint  Patient presents with  . needs refills    HPI:   34 year old female presents for follow-up on endoscopy with gastric ulceration. EGD done 03/28/2014 showed gastric ulcer and gastric erosions status post biopsy. Recommended to avoid all nonsteroidals including aspirin, increase Protonix to 40 mg twice a day for 3 months. Also provided Carafate suspension 4 times a day for 2 weeks. Biopsy showed gastric body and antral type mucosa with associated minimal chronic inflammation and no H. pylori. Recommended follow-up office visit in 6 weeks, consider gallbladder evaluation of persistent pain. Patient called an couple months after her EGD was continued symptoms and was sent for a gallbladder ultrasound. Ultrasound showed no gallstones but mild prominent size common bile duct measuring 7.5 mm. Follow-up HIDA scan was a normal exam.  Today she states her PCP had decreased her Protonix to once daily but she began having breakthrough symptoms every afternoon so she's been taking it twice daily, which keeps her symptoms under control. Denies hematochezia, melena, hematemesis. Has previously been constipated with a bowel movement every couple days but since starting Linzess she is much more regular. Denies any other upper or lower GI symptoms. Denies fever, chills, N/V, unintentional weight loss, fatigue, chest pain, and dyspnea.  Past Medical History  Diagnosis Date  . Constipation     Past Surgical History  Procedure Laterality Date  . Abdominal hysterectomy    . Appendectomy    . Colonoscopy  08/16/2009    Dr. Rourk:friable anal canal otherwise normal/scattered left-sided diverticula  . Esophagogastroduodenoscopy N/A 03/28/2014    RMR: gastric ulcer. Gastric erosions-status post gastric biopsy    Current Outpatient Prescriptions  Medication Sig Dispense Refill  . BEE  POLLEN PO Take 550 mg by mouth daily.     . cetirizine (ZYRTEC) 10 MG tablet Take 10 mg by mouth daily.    Marland Kitchen esomeprazole (NEXIUM) 40 MG capsule Take 40 mg by mouth daily at 12 noon.    . Linaclotide (LINZESS) 290 MCG CAPS capsule Take 1 capsule (290 mcg total) by mouth daily. 30 capsule 5  . pantoprazole (PROTONIX) 40 MG tablet Take 1 tablet (40 mg total) by mouth daily. Take 30 minutes before breakfast 30 tablet 5  . HYDROcodone-acetaminophen (NORCO/VICODIN) 5-325 MG per tablet Take 1 tablet by mouth every 4 (four) hours as needed. (Patient not taking: Reported on 11/01/2014) 15 tablet 0  . hydrocortisone (ANUSOL-HC) 25 MG suppository Place 1 suppository (25 mg total) rectally every 12 (twelve) hours. (Patient not taking: Reported on 11/01/2014) 12 suppository 1  . ondansetron (ZOFRAN) 4 MG tablet Take 1 tablet (4 mg total) by mouth every 8 (eight) hours as needed for nausea or vomiting. (Patient not taking: Reported on 11/01/2014) 20 tablet 0  . polyethylene glycol powder (GLYCOLAX/MIRALAX) powder as needed.     . sucralfate (CARAFATE) 1 GM/10ML suspension Take 10 mLs (1 g total) by mouth 4 (four) times daily. (Patient not taking: Reported on 11/01/2014) 420 mL 1   No current facility-administered medications for this visit.    Allergies as of 11/01/2014  . (No Known Allergies)    Family History  Problem Relation Age of Onset  . Colon cancer Neg Hx     History   Social History  . Marital Status: Married    Spouse Name: N/A  . Number of Children: N/A  . Years of Education: N/A  Social History Main Topics  . Smoking status: Current Every Day Smoker -- 0.50 packs/day    Types: Cigarettes  . Smokeless tobacco: Not on file  . Alcohol Use: Yes     Comment: occasional  . Drug Use: No     Comment: in past  . Sexual Activity: Not on file   Other Topics Concern  . None   Social History Narrative    Review of Systems: Gen: Denies fever, chills, anorexia. Denies fatigue, weakness,  weight loss.  CV: Denies chest pain, palpitations, syncope, peripheral edema, and claudication. Resp: Denies dyspnea at rest, cough, wheezing, coughing up blood, and pleurisy. GI: Denies vomiting blood, jaundice, and fecal incontinence.   Denies dysphagia or odynophagia. Derm: Denies rash, itching, dry skin Psych: Denies depression, anxiety, memory loss, confusion. No homicidal or suicidal ideation.  Heme: Denies bruising, bleeding, and enlarged lymph nodes.  Physical Exam: BP 117/67 mmHg  Pulse 75  Temp(Src) 98.2 F (36.8 C)  Ht 5\' 4"  (1.626 m)  Wt 168 lb 9.6 oz (76.476 kg)  BMI 28.93 kg/m2 General:   Alert and oriented. No distress noted. Pleasant and cooperative.  Head:  Normocephalic and atraumatic. Eyes:  Conjuctiva clear without scleral icterus. Mouth:  Oral mucosa pink and moist. Good dentition. No lesions. Neck:  Supple, without mass or thyromegaly. Lungs:  Clear to auscultation bilaterally. No wheezes, rales, or rhonchi. No distress.  Heart:  S1, S2 present without murmurs, rubs, or gallops. Regular rate and rhythm. Abdomen:  +BS, soft, non-tender and non-distended. No rebound or guarding. No HSM or masses noted. Msk:  Symmetrical without gross deformities. Normal posture. Pulses:  2+ DP noted bilaterally Extremities:  Without edema. Neurologic:  Alert and  oriented x4;  grossly normal neurologically. Skin:  Intact without significant lesions or rashes. Cervical Nodes:  No significant cervical adenopathy. Psych:  Alert and cooperative. Normal mood and affect.    11/01/2014 2:34 PM

## 2014-11-01 NOTE — Patient Instructions (Signed)
1. Contionue your Protonix twice a day 2. Continue your Linzess 3. I will send in a refill for the Linzess for you. Let me know if you need any additional refills for either medication. 4. Follow-up as needed.

## 2014-11-01 NOTE — Assessment & Plan Note (Signed)
History of GERD symptoms, on last endoscopy was noted to have gastric ulcerations. Her Protonix increased to twice a day for 3 months. Her PCP attempted to reduce this back down to once a day at which point her symptoms began occurring consistently has breakthrough symptoms in the afternoon. She began taking her Protonix again twice a day which is since resolved her symptoms again. She is going to address this issue with her PCP next week. No red flag or warning signs or symptoms noted. Return as needed or sooner if problems arise.

## 2014-11-02 NOTE — Progress Notes (Signed)
CC'ED TO PCP 

## 2014-11-14 ENCOUNTER — Other Ambulatory Visit: Payer: Self-pay

## 2014-11-15 MED ORDER — LINACLOTIDE 290 MCG PO CAPS: 290.0000 ug | ORAL_CAPSULE | Freq: Every day | ORAL | Status: DC

## 2014-11-16 ENCOUNTER — Other Ambulatory Visit: Payer: Self-pay

## 2014-12-01 ENCOUNTER — Telehealth: Payer: Self-pay | Admitting: *Deleted

## 2014-12-01 ENCOUNTER — Encounter: Payer: Self-pay | Admitting: *Deleted

## 2014-12-01 NOTE — Telephone Encounter (Signed)
Pt called wanted to know what kind of "female surgery" she had. I informed the pt that she had an Endometrial Ablation but the year is unknown. Pt stated that she needed to know so that she could tell he NEW PCP.

## 2018-08-04 ENCOUNTER — Other Ambulatory Visit: Payer: Self-pay

## 2018-08-04 ENCOUNTER — Encounter (HOSPITAL_COMMUNITY): Payer: Self-pay | Admitting: Emergency Medicine

## 2018-08-04 ENCOUNTER — Emergency Department (HOSPITAL_COMMUNITY)
Admission: EM | Admit: 2018-08-04 | Discharge: 2018-08-04 | Disposition: A | Discharge status: Home / Self Care | Payer: 59 | Attending: Emergency Medicine | Admitting: Emergency Medicine

## 2018-08-04 DIAGNOSIS — Z79899 Other long term (current) drug therapy: Secondary | ICD-10-CM | POA: Diagnosis not present

## 2018-08-04 DIAGNOSIS — K644 Residual hemorrhoidal skin tags: Secondary | ICD-10-CM | POA: Diagnosis present

## 2018-08-04 DIAGNOSIS — F17210 Nicotine dependence, cigarettes, uncomplicated: Secondary | ICD-10-CM | POA: Insufficient documentation

## 2018-08-04 DIAGNOSIS — K5900 Constipation, unspecified: Secondary | ICD-10-CM | POA: Diagnosis not present

## 2018-08-04 HISTORY — DX: Unspecified hemorrhoids: K64.9

## 2018-08-04 MED ORDER — HYDROMORPHONE HCL 1 MG/ML IJ SOLN: 0.5000 mg | Freq: Once | INTRAMUSCULAR | Status: DC

## 2018-08-04 MED ORDER — HYDROMORPHONE HCL 1 MG/ML IJ SOLN
0.5000 mg | Freq: Once | INTRAMUSCULAR | Status: AC
  Administered 2018-08-04: 0.5 mg via INTRAVENOUS
  Filled 2018-08-04: qty 1

## 2018-08-04 MED ORDER — MELOXICAM 15 MG PO TABS: 15.0000 mg | ORAL_TABLET | Freq: Every day | ORAL | 0 refills | Status: DC

## 2018-08-04 MED ORDER — PROMETHAZINE HCL 12.5 MG PO TABS
12.5000 mg | ORAL_TABLET | Freq: Once | ORAL | Status: AC
  Administered 2018-08-04: 12.5 mg via ORAL
  Filled 2018-08-04: qty 1

## 2018-08-04 MED ORDER — MAGNESIUM HYDROXIDE 400 MG/5ML PO SUSP
30.0000 mL | Freq: Once | ORAL | Status: AC
  Administered 2018-08-04: 30 mL via ORAL
  Filled 2018-08-04: qty 30

## 2018-08-04 MED ORDER — HYDROCODONE-ACETAMINOPHEN 5-325 MG PO TABS: 1.0000 | ORAL_TABLET | ORAL | 0 refills | Status: DC | PRN

## 2018-08-04 NOTE — ED Triage Notes (Signed)
Patient states she has history of hemorrhoids and is having swelling and pain to anal area since yesterday after bowel movement. Denies bleeding.

## 2018-08-04 NOTE — Discharge Instructions (Signed)
You were treated in the emergency department with IV Dilaudid.  Please use caution getting around today, as you may be drowsy, and or lightheaded following this medication.  Please continue your current stool softening medications.  Please use Mobic 1 daily with a meal, may use Norco every 4-6 hours as needed for pain. This medication may cause drowsiness. Please do not drink, drive, or participate in activity that requires concentration while taking this medication.  Please keep your appointment with your specialist on tomorrow as scheduled.

## 2018-08-04 NOTE — ED Provider Notes (Signed)
[facility] EMERGENCY DEPARTMENT Provider Note   CSN: [id number] Arrival date & time: 08/04/18  2060     History   Chief Complaint Chief Complaint  Patient presents with  . Hemorrhoids    HPI [name] is a 38 y.o. female.  Patient is a 38 year old female who presents to the emergency department with a complaint of hemorrhoids.  Patient states she has been dealing with hemorrhoids for several years.  Recently she has had problems with both constipation and diarrhea.  She is currently taking MiraLAX, and she is also using a lidocaine solution to help with her hemorrhoids.  She noted swelling and anal pain on yesterday January 14 after attempting a bowel movement.  She says the movement was somewhat constipated.  She has not had any fever or chills.  Is been no injury to the rectal area.  She is scheduled to see a proctology specialist on tomorrow, but felt that she could not take the pain any longer.  She says it even hurts her to pee she is having so much pain.  The history is provided by the patient.    Past Medical History:  Diagnosis Date  . Constipation   . Hemorrhoid     Patient Active Problem List   Diagnosis Date Noted  . Gastric ulcer 11/01/2014  . Dyspepsia 03/27/2014  . EXTERNAL HEMORRHOIDS, THROMBOSED 09/14/2009  . DIVERTICULOSIS, COLON 09/14/2009  . MIGRAINE, CHRONIC 08/09/2009  . GERD 08/09/2009  . CONSTIPATION, CHRONIC 08/09/2009    Past Surgical History:  Procedure Laterality Date  . APPENDECTOMY    . COLONOSCOPY  08/16/2009   Dr. Rourk:friable anal canal otherwise normal/scattered left-sided diverticula  . ENDOMETRIAL ABLATION    . ESOPHAGOGASTRODUODENOSCOPY N/A 03/28/2014   RMR: gastric ulcer. Gastric erosions-status post gastric biopsy     OB History   No obstetric history on file.      Home Medications    Prior to Admission medications   Medication Sig Start Date End Date Taking? Authorizing Provider  BEE POLLEN PO Take 550 mg by  mouth daily.     [provider]  cetirizine (ZYRTEC) 10 MG tablet Take 10 mg by mouth daily.    [provider]  esomeprazole (NEXIUM) 40 MG capsule Take 40 mg by mouth daily at 12 noon.    [provider]  HYDROcodone-acetaminophen (NORCO/VICODIN) 5-325 MG per tablet Take 1 tablet by mouth every 4 (four) hours as needed. Patient not taking: Reported on 11/01/2014 03/16/14   Ward, Layla Maw, DO  hydrocortisone (ANUSOL-HC) 25 MG suppository Place 1 suppository (25 mg total) rectally every 12 (twelve) hours. Patient not taking: Reported on 11/01/2014 03/24/14   Gelene Mink, NP  Linaclotide [facility]) 290 MCG CAPS capsule Take 1 capsule (290 mcg total) by mouth daily. 11/15/14   Gelene Mink, NP  ondansetron (ZOFRAN) 4 MG tablet Take 1 tablet (4 mg total) by mouth every 8 (eight) hours as needed for nausea or vomiting. Patient not taking: Reported on 11/01/2014 03/16/14   Ward, Layla Maw, DO  pantoprazole (PROTONIX) 40 MG tablet Take 1 tablet (40 mg total) by mouth daily. Take 30 minutes before breakfast 10/03/14   Gelene Mink, NP  polyethylene glycol powder [facility]) powder as needed.  02/09/14   [provider]  sucralfate (CARAFATE) 1 GM/10ML suspension Take 10 mLs (1 g total) by mouth 4 (four) times daily. Patient not taking: Reported on 11/01/2014 05/03/14   Gelene Mink, NP    Family  History Family History  Problem Relation Age of Onset  . Colon cancer Neg Hx     Social History Social History   Tobacco Use  . Smoking status: Current Every Day Smoker    Packs/day: 0.50    Types: Cigarettes  . Smokeless tobacco: Never Used  Substance Use Topics  . Alcohol use: Yes    Alcohol/week: 0.0 standard drinks    Comment: occasional/rarely  . Drug use: No    Types: Marijuana    Comment: in past     Allergies   Patient has no known allergies.   Review of Systems Review of Systems  Constitutional: Negative for activity change.       All ROS  Neg except as noted in HPI  HENT: Negative for nosebleeds.   Eyes: Negative for photophobia and discharge.  Respiratory: Negative for cough, shortness of breath and wheezing.   Cardiovascular: Negative for chest pain and palpitations.  Gastrointestinal: Negative for abdominal pain and blood in stool.  Genitourinary: Negative for dysuria, frequency and hematuria.       Rectal pain  Musculoskeletal: Negative for arthralgias, back pain and neck pain.  Skin: Negative.   Neurological: Negative for dizziness, seizures and speech difficulty.  Psychiatric/Behavioral: Negative for confusion and hallucinations.     Physical Exam Updated Vital Signs BP (!) 139/99 (BP Location: Right Arm)   Pulse (!) 101   Temp 97.9 F (36.6 C) (Oral)   Resp 20   Ht 5\' 4"  (1.626 m)   Wt 77.1 kg   SpO2 97%   BMI 29.18 kg/m   Physical Exam Vitals signs and nursing note reviewed.  Constitutional:      Appearance: She is well-developed. She is not toxic-appearing.  HENT:     Head: Normocephalic.     Right Ear: Tympanic membrane and external ear normal.     Left Ear: Tympanic membrane and external ear normal.  Eyes:     General: Lids are normal.     Pupils: Pupils are equal, round, and reactive to light.  Neck:     Musculoskeletal: Normal range of motion and neck supple.     Vascular: No carotid bruit.  Cardiovascular:     Rate and Rhythm: Normal rate and regular rhythm.     Pulses: Normal pulses.     Heart sounds: Normal heart sounds.  Pulmonary:     Effort: No respiratory distress.     Breath sounds: Normal breath sounds.  Abdominal:     General: Bowel sounds are normal.     Palpations: Abdomen is soft.     Tenderness: There is no abdominal tenderness. There is no guarding.  Genitourinary:    Comments: Chaperone present during the examination.  Patient has 3 areas of external hemorrhoid.  There are no red streaks appreciated.  No evidence of fistula.  There is no bleeding appreciated at this  time, no drainage appreciated.  Patient has good sphincter tone. Musculoskeletal: Normal range of motion.  Lymphadenopathy:     Head:     Right side of head: No submandibular adenopathy.     Left side of head: No submandibular adenopathy.     Cervical: No cervical adenopathy.  Skin:    General: Skin is warm and dry.  Neurological:     Mental Status: She is alert and oriented to person, place, and time.     Cranial Nerves: No cranial nerve deficit.     Sensory: No sensory deficit.  Psychiatric:  Speech: Speech normal.      ED Treatments / Results  Labs (all labs ordered are listed, but only abnormal results are displayed) Labs Reviewed - No data to display  EKG None  Radiology No results found.  Procedures Procedures (including critical care time)  Medications Ordered in ED Medications - No data to display   Initial Impression / Assessment and Plan / ED Course  I have reviewed the triage vital signs and the nursing notes.  Pertinent labs & imaging results that were available during my care of the patient were reviewed by me and considered in my medical decision making (see chart for details).       Final Clinical Impressions(s) / ED Diagnoses MDM  Vital signs reviewed.  Heart rate is slowed down to 98 during my examination.  Patient has a history of hemorrhoid issues.  She has been bothered with both constipation and diarrhea recently.  She is noted at this time to have 3 external area hemorrhoids.  There is no bleeding appreciated, no drainage, no red streaking or signs of cellulitis in the area.  Patient has a history with a proctology specialist on tomorrow.  We will try to assist the patient with some pain relief today.  Patient treated with IV Dilaudid here in the emergency department.  4 tablets of Norco given to the patient to use for today. Pt investigated in the PMP Aware data base. No recent Rx found.   Final diagnoses:  External hemorrhoids     ED Discharge Orders         Ordered    HYDROcodone-acetaminophen (NORCO/VICODIN) 5-325 MG tablet  Every 4 hours PRN     08/04/18 0909    meloxicam (MOBIC) 15 MG tablet  Daily     08/04/18 0909           Ivery Quale, PA-C 08/04/18 0930    Pricilla Loveless, MD 08/04/18 [phone]

## 2018-12-06 ENCOUNTER — Ambulatory Visit: Payer: Self-pay | Admitting: Surgery

## 2018-12-06 NOTE — H&P (View-Only) (Signed)
[name] Documented: 11/29/2018 8:47 AM Location: [facility] Patient #: [id number] DOB: 04/28/1981 Married / Language: English / Race: White Female   History of Present Illness (Shady Padron C. Yvette Loveless MD; 11/29/2018 9:54 AM) The patient is a 37 year old female who presents with hemorrhoids. Note for "Hemorrhoids": ` ` ` Patient sent for surgical consultation at the request of Dr Jyothi MAnn, [facility]  Chief Complaint: Anal pain with history of thrombosed hemorrhoid.  ` ` Patient returns with complaints of anal pain. She had an obvious thrombosed hemorrhoid that was sent to us urgently and underwent incision and drainage done in the office 08/05/2018. She felt relief and was healed up on a follow-up visit in February. Patient notes that she is began to get anal pain again. Uncomfortable during bowel movement but gets a lot of burning and itching and sharp pain. No major bleeding. As noted in prior visits, she does struggle with some irritable bowel and constipation. She seemed to have some decent results with MiraLAX but was not fully satisfied. She was offered a newer drugs the. She cannot recall the name. She had a lot of diarrhea with it and stopped after few days. She is back to MiraLAX moves her bowels once or twice a day. No problems with urination. She's been trying to manage anal pain with sitz baths. She saw her gastroenterologist who was concerned about a fissure. Was given prescription through a compounding pharmacy. [place]. Most likely diltiazem. She recalls that name. She try it for a few days but had to stop secondary to worsening burning. She is frustrated and wondered if something else needed to happen. She comes in by herself.  No personal nor family history of GI/colon cancer, inflammatory bowel disease, allergy such as Celiac Sprue, dietary/dairy problems, colitis, ulcers nor gastritis. No recent sick contacts/gastroenteritis. No travel outside the  country. No changes in diet. No dysphagia to solids or liquids. No significant heartburn or reflux. No hematochezia, hematemesis, coffee ground emesis. No evidence of prior gastric/peptic ulceration.  (Review of systems as stated in this history (HPI) or in the review of systems. Otherwise all other 12 point ROS are negative) ` ` `   Problem List/Past Medical (Armonee Bojanowski C. Jahne Krukowski, MD; 11/29/2018 8:49 AM) EXTERNAL HEMORRHOIDS WITH COMPLICATION (K64.4)  THROMBOSED HEMORRHOIDS (K64.5)  TOBACCO ABUSE (Z72.0)   Past Surgical History (Celese Banner C. Sasha Rueth, MD; 11/29/2018 8:49 AM) No pertinent past surgical history   Diagnostic Studies History (Deloyd Handy C. Tjuana Vickrey, MD; 11/29/2018 8:49 AM) Colonoscopy  >10 years ago Pap Smear  1-5 years ago  Allergies (Sabrina Canty, CMA; 11/29/2018 8:47 AM) No Known Drug Allergies [08/05/2018]: Allergies Reconciled   Medication History (Sabrina Canty, CMA; 11/29/2018 8:47 AM) NexIUM (40MG Capsule DR, Oral) Active. Omeprazole (40MG Capsule DR, Oral) Active. Lidocaine (5% Ointment, External) Active. Medications Reconciled  Social History (Johndaniel Catlin C. Anayansi Rundquist, MD; 11/29/2018 8:49 AM) Caffeine use  Carbonated beverages, Coffee. No drug use  Tobacco use  Current every day smoker.  Family History (Ygnacio Fecteau C. Jiovanna Frei, MD; 11/29/2018 8:49 AM) Diabetes Mellitus  Mother. Heart Disease  Brother, Mother. Heart disease in female family member before age 65  Heart disease in female family member before age 55  Hypertension  Father.  Pregnancy / Birth History (Carinne Brandenburger C. Aalani Aikens, MD; 11/29/2018 8:49 AM) Age at menarche  11 years. Contraceptive History  Oral contraceptives. Gravida  1 Irregular periods  Maternal age  15-20 Para  1  Other Problems (Osaze Hubbert C. Dainelle Hun, MD; 11/29/2018 8:49 AM)   Gastric Ulcer  Gastroesophageal Reflux Disease  Hemorrhoids     Review of Systems [name], MD; 11/29/2018 8:49 AM) General Not Present- Appetite Loss,  Chills, Fatigue, Fever, Night Sweats, Weight Gain and Weight Loss. Skin Present- New Lesions. Not Present- Change in Wart/Mole, Dryness, Hives, Jaundice, Non-Healing Wounds, Rash and Ulcer. HEENT Not Present- Earache, Hearing Loss, Hoarseness, Nose Bleed, Oral Ulcers, Ringing in the Ears, Seasonal Allergies, Sinus Pain, Sore Throat, Visual Disturbances, Wears glasses/contact lenses and Yellow Eyes. Respiratory Not Present- Bloody sputum, Chronic Cough, Difficulty Breathing, Snoring and Wheezing. Breast Not Present- Breast Mass, Breast Pain, Nipple Discharge and Skin Changes. Cardiovascular Not Present- Chest Pain, Difficulty Breathing Lying Down, Leg Cramps, Palpitations, Rapid Heart Rate, Shortness of Breath and Swelling of Extremities. Gastrointestinal Present- Constipation, Hemorrhoids and Rectal Pain. Not Present- Abdominal Pain, Bloating, Bloody Stool, Change in Bowel Habits, Chronic diarrhea, Difficulty Swallowing, Excessive gas, Gets full quickly at meals, Indigestion, Nausea and Vomiting. Female Genitourinary Present- Frequency. Not Present- Nocturia, Painful Urination, Pelvic Pain and Urgency. Musculoskeletal Present- Back Pain. Not Present- Joint Pain, Joint Stiffness, Muscle Pain, Muscle Weakness and Swelling of Extremities. Neurological Not Present- Decreased Memory, Fainting, Headaches, Numbness, Seizures, Tingling, Tremor, Trouble walking and Weakness. Psychiatric Not Present- Anxiety, Bipolar, Change in Sleep Pattern, Depression, Fearful and Frequent crying. Endocrine Not Present- Cold Intolerance, Excessive Hunger, Hair Changes, Heat Intolerance, Hot flashes and New Diabetes. Hematology Not Present- Blood Thinners, Easy Bruising, Excessive bleeding, Gland problems, HIV and Persistent Infections.  Vitals (Sabrina Canty CMA; 11/29/2018 8:48 AM) 11/29/2018 8:47 AM Weight: 164.13 lb Height: 64in Body Surface Area: 1.8 m Body Mass Index: 28.17 kg/m  Temp.: 98.63F  Pulse: 98  (Regular)  BP: 138/82 (Sitting, Left Arm, Standard)       Physical Exam [name] MD; 11/29/2018 9:01 AM) General Mental Status-Alert. General Appearance-Not in acute distress, Not Sickly. Orientation-Oriented X3. Hydration-Well hydrated. Voice-Normal. Note: Lying on side. Obviously uncomfortable. Non toxic/sickly.   Integumentary Global Assessment Normal Exam - Axillae: non-tender, no inflammation or ulceration, no drainage. and Distribution of scalp and body hair is normal. General Characteristics Temperature - normal warmth is noted.  Head and Neck Head-normocephalic, atraumatic with no lesions or palpable masses. Face Global Assessment - atraumatic, no absence of expression. Neck Global Assessment - no abnormal movements, no bruit auscultated on the right, no bruit auscultated on the left, no decreased range of motion, non-tender. Trachea-midline. Thyroid Gland Characteristics - non-tender.  Eye Eyeball - Left-Extraocular movements intact, No Nystagmus. Eyeball - Right-Extraocular movements intact, No Nystagmus. Cornea - Left-No Hazy. Cornea - Right-No Hazy. Sclera/Conjunctiva - Left-No scleral icterus, No Discharge. Sclera/Conjunctiva - Right-No scleral icterus, No Discharge. Pupil - Left-Direct reaction to light normal. Pupil - Right-Direct reaction to light normal.  ENMT Ears Pinna - Left - no drainage observed, no generalized tenderness observed. Right - no drainage observed, no generalized tenderness observed. Nose and Sinuses Nose - no destructive lesion observed. Nares - Left - quiet respiration. Right - quiet respiration. Mouth and Throat Lips - Upper Lip - no fissures observed, no pallor noted. Lower Lip - no fissures observed, no pallor noted. Nasopharynx - no discharge present. Oral Cavity/Oropharynx - Tongue - no dryness observed. Oral Mucosa - no cyanosis observed. Hypopharynx - no evidence of airway distress  observed.  Chest and Lung Exam Inspection Movements - Normal and Symmetrical. Accessory muscles - No use of accessory muscles in breathing. Palpation Normal exam - Non-tender. Auscultation Breath sounds - Normal and Clear.  Cardiovascular Auscultation Rhythm -  Regular. Murmurs & Other Heart Sounds - Normal exam - No Murmurs and No Systolic Clicks.  Abdomen Inspection Normal Exam - No Visible peristalsis and No Abnormal pulsations. Umbilicus - No Bleeding, No Urine drainage. Palpation/Percussion Normal exam - Soft, Non Tender, No Rebound tenderness, No Rigidity (guarding) and No Cutaneous hyperesthesia. Note: Abdomen soft. Not severely distended. No distasis recti. No umbilical or other anterior abdominal wall hernias. Umbilical piercing. Clean.   Female Genitourinary Sexual Maturity Tanner 5 - Adult hair pattern. Note: No vaginal bleeding nor discharge. No inguinal hernias or lymphadenopathy   Rectal Note: Posterior midline anal fissure with increased sphincter tone. Very sensitive.  Perianal skin clear. Right anterior and left lateral external hemorrhoid tags. Mildly sensitive but not particularly inflamed. Mild pruritus No thrombosed hemorrhoid. No wound. I held off on any digital internal exam given the obvious fissure and pain   Peripheral Vascular Upper Extremity Inspection - Left - No Cyanotic nailbeds, Not Ischemic. Right - No Cyanotic nailbeds, Not Ischemic.  Neurologic Neurologic evaluation reveals -normal attention span and ability to concentrate, able to name objects and repeat phrases. Appropriate fund of knowledge , normal sensation and normal coordination. Mental Status Affect - not angry, not paranoid. Cranial Nerves-Normal Bilaterally. Gait-Normal.  Neuropsychiatric Mental status exam performed with findings of-able to articulate well with normal speech/language, rate, volume and coherence, thought content normal with ability to  perform basic computations and apply abstract reasoning and no evidence of hallucinations, delusions, obsessions or homicidal/suicidal ideation.  Musculoskeletal Global Assessment Spine, Ribs and Pelvis - no instability, subluxation or laxity. Right Upper Extremity - no instability, subluxation or laxity.  Lymphatic Head & Neck General Head & Neck Lymphatics: Bilateral - Description - No Localized lymphadenopathy. Axillary General Axillary Region: Bilateral - Description - No Localized lymphadenopathy. Femoral & Inguinal Generalized Femoral & Inguinal Lymphatics: Left: Right - Description - No Localized lymphadenopathy. Description - No Localized lymphadenopathy.    Assessment & Plan [name] MD; 11/29/2018 9:55 AM) ANAL FISSURE (K60.2) Impression: Pain and burning with obvious anal fissure. No evidence of any recurrent thrombosed hemorrhoid. Not improved with diltiazem cream from [facility].  One Option Is to Try and Switch to Nitroglycerin Cream That can Heal Fissures. Usually Not Well-Tolerated Given Headaches and Other Complaints. I Suspect She Would Benefit from Examination under Anesthesia with Partial Internal Sphincterotomy and Possible Hemorrhoidectomy of Any Remaining Tissue.  She would like to try and hold off on surgery and given the nitroglycerin cream a try. If that does not work, then plan examination under anesthesia with probable partial internal sphincterotomy, internal hemorrhoid ligation, external hemorrhoidectomy. Current Plans The patient was instructed to call back in 2 weeks with progress Started NITROGLYCERIN CREAM, 2% (External Cream), 1 (one) Application four times daily, 15 Applicator, 11/29/2018, Ref. x3. Local Order: Apply to anus for 3-6 weeks to allow fissure to heal. Pt Education - [facility] Anal Fissure (Algie Westry) The anatomy & physiology of the anorectal region was discussed. The pathophysiology of anal fissure and differential diagnosis was  discussed. Natural history progression was discussed. I stressed the importance of a bowel regimen to have daily soft bowel movements to minimize progression of disease.  The patient's condition is not adequately controlled. Non-operative treatment has not healed the fissure. Therefore, I recommended examination under anesthesia for better examination to confirm the diagnosis and treat by lateral internal sphincterotomy to relax the spasm better & allow the fissure to heal. Technique, benefits, alternatives were discussed. I noted a good likelihood this  will help address the problem. Risks such as bleeding, pain, incontinence, recurrence, heart attack, death, and other risks were discussed.  Educational handouts further explaining the pathology, treatment options, and bowel regimen were given as well. The patient expressed understanding & wishes to proceed with surgery.  EXTERNAL HEMORRHOIDS WITH COMPLICATION (K64.4) Impression: External hemorrhoids. B explain some of her symptoms I think the fissures to primary culprit this time. Should it come to surgery, would make sense to try and remove these as well since she has issues with hygiene and irritation. She would like to hold off on any surgery but it becomes active, she agrees with proceeding with hemorrhoidal ligation/external hemorrhoidectomy  The anatomy & physiology of the anorectal region was discussed. The pathophysiology of hemorrhoids and differential diagnosis was discussed. Natural history progression was discussed. I stressed the importance of a bowel regimen to have daily soft bowel movements to minimize progression of disease. Goal of one BM / day ideal. Use of wet wipes, warm baths, avoiding straining, etc were emphasized.  Educational handouts further explaining the pathology, treatment options, and bowel regimen were given as well. The patient expressed understanding. Current Plans Pt Education - [facility] Hemorrhoids (Toan Mort):  discussed with patient and provided information.  Ardeth SportsmanSteven C. Bryna Razavi, MD, FACS, MASCRS Gastrointestinal and Minimally Invasive Surgery    [address], [address] [place], [place] [id number] [phone] Main / Paging [phone] Fax

## 2018-12-06 NOTE — H&P (Signed)
[name] Documented: 11/29/2018 8:47 AM Location: [facility] Patient #: [id number] DOB: [birth year] Married / Language: English / Race: White Female   History of Present Illness Erin Sportsman MD; 11/29/2018 9:54 AM) The patient is a 38 year old female who presents with hemorrhoids. Note for "Hemorrhoids": ` ` ` Patient sent for surgical consultation at the request of Dr Charna Elizabeth, [facility]  Chief Complaint: Anal pain with history of thrombosed hemorrhoid.  ` ` Patient returns with complaints of anal pain. She had an obvious thrombosed hemorrhoid that was sent to [place] urgently and underwent incision and drainage done in the office 08/05/2018. She felt relief and was healed up on a follow-up visit in February. Patient notes that she is began to get anal pain again. Uncomfortable during bowel movement but gets a lot of burning and itching and sharp pain. No major bleeding. As noted in prior visits, she does struggle with some irritable bowel and constipation. She seemed to have some decent results with MiraLAX but was not fully satisfied. She was offered a newer drugs the. She cannot recall the name. She had a lot of diarrhea with it and stopped after few days. She is back to MiraLAX moves her bowels once or twice a day. No problems with urination. She's been trying to manage anal pain with sitz baths. She saw her gastroenterologist who was concerned about a fissure. Was given prescription through a compounding pharmacy. [place]. Most likely diltiazem. She recalls that name. She try it for a few days but had to stop secondary to worsening burning. She is frustrated and wondered if something else needed to happen. She comes in by herself.  No personal nor family history of GI/colon cancer, inflammatory bowel disease, allergy such as Celiac Sprue, dietary/dairy problems, colitis, ulcers nor gastritis. No recent sick contacts/gastroenteritis. No travel outside the  country. No changes in diet. No dysphagia to solids or liquids. No significant heartburn or reflux. No hematochezia, hematemesis, coffee ground emesis. No evidence of prior gastric/peptic ulceration.  (Review of systems as stated in this history (HPI) or in the review of systems. Otherwise all other 12 point ROS are negative) ` ` `   Problem List/Past Medical Erin Sportsman, MD; 11/29/2018 8:49 AM) EXTERNAL HEMORRHOIDS WITH COMPLICATION (K64.4)  THROMBOSED HEMORRHOIDS (K64.5)  TOBACCO ABUSE (Z72.0)   Past Surgical History Erin Sportsman, MD; 11/29/2018 8:49 AM) No pertinent past surgical history   Diagnostic Studies History Erin Sportsman, MD; 11/29/2018 8:49 AM) Colonoscopy  >10 years ago Pap Smear  1-5 years ago  Allergies Kristen Cardinal, CMA; 11/29/2018 8:47 AM) No Known Drug Allergies [08/05/2018]: Allergies Reconciled   Medication History Kristen Cardinal, CMA; 11/29/2018 8:47 AM) NexIUM (  Capsule DR, Oral) Active. Omeprazole (  Capsule DR, Oral) Active. Lidocaine (5% Ointment, External) Active. Medications Reconciled  Social History Erin Sportsman, MD; 11/29/2018 8:49 AM) Caffeine use  Carbonated beverages, Coffee. No drug use  Tobacco use  Current every day smoker.  Family History Erin Sportsman, MD; 11/29/2018 8:49 AM) Diabetes Mellitus  Mother. Heart Disease  Brother, Mother. Heart disease in female family member before age 64  Heart disease in female family member before age 14  Hypertension  Father.  Pregnancy / Birth History Erin Sportsman, MD; 11/29/2018 8:49 AM) Age at menarche  11 years. Contraceptive History  Oral contraceptives. Gravida  1 Irregular periods  Maternal age  31-20 Para  1  Other Problems Erin Sportsman, MD; 11/29/2018 8:49 AM)  Gastric Ulcer  Gastroesophageal Reflux Disease  Hemorrhoids     Review of Systems Erin Sportsman, MD; 11/29/2018 8:49 AM) General Not Present- Appetite Loss,  Chills, Fatigue, Fever, Night Sweats, Weight Gain and Weight Loss. Skin Present- New Lesions. Not Present- Change in Wart/Mole, Dryness, Hives, Jaundice, Non-Healing Wounds, Rash and Ulcer. HEENT Not Present- Earache, Hearing Loss, Hoarseness, Nose Bleed, Oral Ulcers, Ringing in the Ears, Seasonal Allergies, Sinus Pain, Sore Throat, Visual Disturbances, Wears glasses/contact lenses and Yellow Eyes. Respiratory Not Present- Bloody sputum, Chronic Cough, Difficulty Breathing, Snoring and Wheezing. Breast Not Present- Breast Mass, Breast Pain, Nipple Discharge and Skin Changes. Cardiovascular Not Present- Chest Pain, Difficulty Breathing Lying Down, Leg Cramps, Palpitations, Rapid Heart Rate, Shortness of Breath and Swelling of Extremities. Gastrointestinal Present- Constipation, Hemorrhoids and Rectal Pain. Not Present- Abdominal Pain, Bloating, Bloody Stool, Change in Bowel Habits, Chronic diarrhea, Difficulty Swallowing, Excessive gas, Gets full quickly at meals, Indigestion, Nausea and Vomiting. Female Genitourinary Present- Frequency. Not Present- Nocturia, Painful Urination, Pelvic Pain and Urgency. Musculoskeletal Present- Back Pain. Not Present- Joint Pain, Joint Stiffness, Muscle Pain, Muscle Weakness and Swelling of Extremities. Neurological Not Present- Decreased Memory, Fainting, Headaches, Numbness, Seizures, Tingling, Tremor, Trouble walking and Weakness. Psychiatric Not Present- Anxiety, Bipolar, Change in Sleep Pattern, Depression, Fearful and Frequent crying. Endocrine Not Present- Cold Intolerance, Excessive Hunger, Hair Changes, Heat Intolerance, Hot flashes and New Diabetes. Hematology Not Present- Blood Thinners, Easy Bruising, Excessive bleeding, Gland problems, HIV and Persistent Infections.  Vitals (Sabrina Canty CMA; 11/29/2018 8:48 AM) 11/29/2018 8:47 AM Weight: 164.13 lb Height: 64in Body Surface Area: 1.8 m Body Mass Index: 28.17 kg/m  Temp.: 98.63F  Pulse: 98  (Regular)  BP: 138/82 (Sitting, Left Arm, Standard)       Physical Exam Erin Sportsman MD; 11/29/2018 9:01 AM) General Mental Status-Alert. General Appearance-Not in acute distress, Not Sickly. Orientation-Oriented X3. Hydration-Well hydrated. Voice-Normal. Note: Lying on side. Obviously uncomfortable. Non toxic/sickly.   Integumentary Global Assessment Normal Exam - Axillae: non-tender, no inflammation or ulceration, no drainage. and Distribution of scalp and body hair is normal. General Characteristics Temperature - normal warmth is noted.  Head and Neck Head-normocephalic, atraumatic with no lesions or palpable masses. Face Global Assessment - atraumatic, no absence of expression. Neck Global Assessment - no abnormal movements, no bruit auscultated on the right, no bruit auscultated on the left, no decreased range of motion, non-tender. Trachea-midline. Thyroid Gland Characteristics - non-tender.  Eye Eyeball - Left-Extraocular movements intact, No Nystagmus. Eyeball - Right-Extraocular movements intact, No Nystagmus. Cornea - Left-No Hazy. Cornea - Right-No Hazy. Sclera/Conjunctiva - Left-No scleral icterus, No Discharge. Sclera/Conjunctiva - Right-No scleral icterus, No Discharge. Pupil - Left-Direct reaction to light normal. Pupil - Right-Direct reaction to light normal.  ENMT Ears Pinna - Left - no drainage observed, no generalized tenderness observed. Right - no drainage observed, no generalized tenderness observed. Nose and Sinuses Nose - no destructive lesion observed. Nares - Left - quiet respiration. Right - quiet respiration. Mouth and Throat Lips - Upper Lip - no fissures observed, no pallor noted. Lower Lip - no fissures observed, no pallor noted. Nasopharynx - no discharge present. Oral Cavity/Oropharynx - Tongue - no dryness observed. Oral Mucosa - no cyanosis observed. Hypopharynx - no evidence of airway distress  observed.  Chest and Lung Exam Inspection Movements - Normal and Symmetrical. Accessory muscles - No use of accessory muscles in breathing. Palpation Normal exam - Non-tender. Auscultation Breath sounds - Normal and Clear.  Cardiovascular Auscultation Rhythm -  Regular. Murmurs & Other Heart Sounds - Normal exam - No Murmurs and No Systolic Clicks.  Abdomen Inspection Normal Exam - No Visible peristalsis and No Abnormal pulsations. Umbilicus - No Bleeding, No Urine drainage. Palpation/Percussion Normal exam - Soft, Non Tender, No Rebound tenderness, No Rigidity (guarding) and No Cutaneous hyperesthesia. Note: Abdomen soft. Not severely distended. No distasis recti. No umbilical or other anterior abdominal wall hernias. Umbilical piercing. Clean.   Female Genitourinary Sexual Maturity Tanner 5 - Adult hair pattern. Note: No vaginal bleeding nor discharge. No inguinal hernias or lymphadenopathy   Rectal Note: Posterior midline anal fissure with increased sphincter tone. Very sensitive.  Perianal skin clear. Right anterior and left lateral external hemorrhoid tags. Mildly sensitive but not particularly inflamed. Mild pruritus No thrombosed hemorrhoid. No wound. I held off on any digital internal exam given the obvious fissure and pain   Peripheral Vascular Upper Extremity Inspection - Left - No Cyanotic nailbeds, Not Ischemic. Right - No Cyanotic nailbeds, Not Ischemic.  Neurologic Neurologic evaluation reveals -normal attention span and ability to concentrate, able to name objects and repeat phrases. Appropriate fund of knowledge , normal sensation and normal coordination. Mental Status Affect - not angry, not paranoid. Cranial Nerves-Normal Bilaterally. Gait-Normal.  Neuropsychiatric Mental status exam performed with findings of-able to articulate well with normal speech/language, rate, volume and coherence, thought content normal with ability to  perform basic computations and apply abstract reasoning and no evidence of hallucinations, delusions, obsessions or homicidal/suicidal ideation.  Musculoskeletal Global Assessment Spine, Ribs and Pelvis - no instability, subluxation or laxity. Right Upper Extremity - no instability, subluxation or laxity.  Lymphatic Head & Neck General Head & Neck Lymphatics: Bilateral - Description - No Localized lymphadenopathy. Axillary General Axillary Region: Bilateral - Description - No Localized lymphadenopathy. Femoral & Inguinal Generalized Femoral & Inguinal Lymphatics: Left: Right - Description - No Localized lymphadenopathy. Description - No Localized lymphadenopathy.    Assessment & Plan Erin Sportsman MD; 11/29/2018 9:55 AM) ANAL FISSURE (K60.2) Impression: Pain and burning with obvious anal fissure. No evidence of any recurrent thrombosed hemorrhoid. Not improved with diltiazem cream from [facility].  One Option Is to Try and Switch to Nitroglycerin Cream That can Heal Fissures. Usually Not Well-Tolerated Given Headaches and Other Complaints. I Suspect She Would Benefit from Examination under Anesthesia with Partial Internal Sphincterotomy and Possible Hemorrhoidectomy of Any Remaining Tissue.  She would like to try and hold off on surgery and given the nitroglycerin cream a try. If that does not work, then plan examination under anesthesia with probable partial internal sphincterotomy, internal hemorrhoid ligation, external hemorrhoidectomy. Current Plans The patient was instructed to call back in 2 weeks with progress Started NITROGLYCERIN CREAM, 2% (External Cream), 1 (one) Application four times daily, 15 Applicator, 11/29/2018, Ref. x3. Local Order: Apply to anus for 3-6 weeks to allow fissure to heal. Pt Education - [facility] Anal Fissure (Praise Dolecki) The anatomy & physiology of the anorectal region was discussed. The pathophysiology of anal fissure and differential diagnosis was  discussed. Natural history progression was discussed. I stressed the importance of a bowel regimen to have daily soft bowel movements to minimize progression of disease.  The patient's condition is not adequately controlled. Non-operative treatment has not healed the fissure. Therefore, I recommended examination under anesthesia for better examination to confirm the diagnosis and treat by lateral internal sphincterotomy to relax the spasm better & allow the fissure to heal. Technique, benefits, alternatives were discussed. I noted a good likelihood this  will help address the problem. Risks such as bleeding, pain, incontinence, recurrence, heart attack, death, and other risks were discussed.  Educational handouts further explaining the pathology, treatment options, and bowel regimen were given as well. The patient expressed understanding & wishes to proceed with surgery.  EXTERNAL HEMORRHOIDS WITH COMPLICATION (K64.4) Impression: External hemorrhoids. B explain some of her symptoms I think the fissures to primary culprit this time. Should it come to surgery, would make sense to try and remove these as well since she has issues with hygiene and irritation. She would like to hold off on any surgery but it becomes active, she agrees with proceeding with hemorrhoidal ligation/external hemorrhoidectomy  The anatomy & physiology of the anorectal region was discussed. The pathophysiology of hemorrhoids and differential diagnosis was discussed. Natural history progression was discussed. I stressed the importance of a bowel regimen to have daily soft bowel movements to minimize progression of disease. Goal of one BM / day ideal. Use of wet wipes, warm baths, avoiding straining, etc were emphasized.  Educational handouts further explaining the pathology, treatment options, and bowel regimen were given as well. The patient expressed understanding. Current Plans Pt Education - [facility] Hemorrhoids (Saori Umholtz):  discussed with patient and provided information.  Erin SportsmanSteven C. Reshonda Koerber, MD, FACS, MASCRS Gastrointestinal and Minimally Invasive Surgery    [address], [address] [place], [place] [id number] [phone] Main / Paging [phone] Fax

## 2018-12-24 NOTE — Patient Instructions (Addendum)
[name]    Your procedure is scheduled on: 12-30-2018  Report to [facility] Main  Entrance  Report to admitting at 530 AM   YOU NEED TO HAVE A COVID 19 TEST ON today 12-27-2018 @  900 am. THIS TEST MUST BE DONE BEFORE SURGERY, COME TO [facility] ENTRANCE.     Call this number if you have problems the morning of surgery [phone]    Remember: . BRUSH YOUR TEETH MORNING OF SURGERY AND RINSE YOUR MOUTH OUT, NO CHEWING GUM CANDY OR MINTS.FOLLOW ALL DR GROOS RECTAL PREP INSTRUCTIONS (SEE BELOW)   Rectal Prep         Obtain a bottle of Milk of Magnesia at a pharmacy   DAY PRIOR TO SURGERY:  Switch to drinking liquids or pureed foods only Drink plenty of liquids all day to avoid getting dehydrated  10:00am: Take 2 oz (4 tablespoons) Milk of Magnesia.  2:00pm: Take 2 oz (4 tablespoons) Milk of Magnesia.  Midnight: Do not eat anything solid after bedtime (midnight) the night before your surgery. BUT DO drink plenty of clear liquids (Water, Gatorade, juice, soda, coffee, tea, broths, etc.) up to 2 hours prior to surgery to avoid getting dehydrated. Clear liquids until 530 am day of surgery.  MORNING OF SURGERY  Remember to not to eat anything solid that morning  Hold or take medications as recommended by the hospital staff at your Preoperative visit  Stop drinking liquids before you leave the house (>3 hours prior to surgery)   If you have questions or problems, please call [facility] [phone] to speak to someone in the clinic department at our office              Take these medicines the morning of surgery with A SIP OF WATER: NEXIUM, HYDROCODONE IF NEEDED                                You may not have any metal on your body including hair pins and              piercings  Do not wear jewelry, make-up, lotions, powders or perfumes, deodorant             Do not wear nail polish.  Do not  shave  48 hours prior to surgery.              Men may shave face and neck.   Do not bring valuables to the hospital. [facility] IS NOT             RESPONSIBLE   FOR VALUABLES.  Contacts, dentures or bridgework may not be worn into surgery.  Leave suitcase in the car. After surgery it may be brought to your room.     Patients discharged the day of surgery will not be allowed to drive home. IF YOU ARE HAVING SURGERY AND GOING HOME THE SAME DAY, YOU MUST HAVE AN ADULT TO DRIVE YOU HOME AND BE WITH YOU FOR 24 HOURS. YOU MAY GO HOME BY TAXI OR UBER OR ORTHERWISE, BUT AN ADULT MUST ACCOMPANY YOU HOME AND STAY WITH YOU FOR 24 HOURS.  Name and phone number of your driver:  Special Instructions: N/A             ______________________________________________________________  [facility] - Preparing for Surgery Before surgery, you can play an important role.  Because skin is not sterile, your skin needs to be as free of germs as possible.  You can reduce the number of germs on your skin by washing with CHG (chlorahexidine gluconate) soap before surgery.  CHG is an antiseptic cleaner which kills germs and bonds with the skin to continue killing germs even after washing. Please DO NOT use if you have an allergy to CHG or antibacterial soaps.  If your skin becomes reddened/irritated stop using the CHG and inform your nurse when you arrive at Short Stay. Do not shave (including legs and underarms) for at least 48 hours prior to the first CHG shower.  You may shave your face/neck. Please follow these instructions carefully:  1.  Shower with CHG Soap the night before surgery and the  morning of Surgery.  2.  If you choose to wash your hair, wash your hair first as usual with your  normal  shampoo.  3.  After you shampoo, rinse your hair and body thoroughly to remove the  shampoo.                           4.  Use CHG as you would any other liquid soap.  You can apply chg directly  to  the skin and wash                       Gently with a scrungie or clean washcloth.  5.  Apply the CHG Soap to your body ONLY FROM THE NECK DOWN.   Do not use on face/ open                           Wound or open sores. Avoid contact with eyes, ears mouth and genitals (private parts).                       Wash face,  Genitals (private parts) with your normal soap.             6.  Wash thoroughly, paying special attention to the area where your surgery  will be performed.  7.  Thoroughly rinse your body with warm water from the neck down.  8.  DO NOT shower/wash with your normal soap after using and rinsing off  the CHG Soap.                9.  Pat yourself dry with a clean towel.            10.  Wear clean pajamas.            11.  Place clean sheets on your bed the night of your first shower and do not  sleep with pets. Day of Surgery : Do not apply any lotions/deodorants the morning of surgery.  Please wear clean clothes to the hospital/surgery center.  FAILURE TO FOLLOW THESE INSTRUCTIONS MAY RESULT IN THE CANCELLATION OF YOUR SURGERY PATIENT SIGNATURE_________________________________  NURSE SIGNATURE__________________________________  ________________________________________________________________________

## 2018-12-27 ENCOUNTER — Other Ambulatory Visit (HOSPITAL_COMMUNITY)
Admission: RE | Admit: 2018-12-27 | Discharge: 2018-12-27 | Disposition: A | Discharge status: Home / Self Care | Payer: 59 | Source: Ambulatory Visit | Attending: Surgery | Admitting: Surgery

## 2018-12-27 ENCOUNTER — Encounter (HOSPITAL_COMMUNITY): Payer: Self-pay

## 2018-12-27 ENCOUNTER — Other Ambulatory Visit: Payer: Self-pay

## 2018-12-27 ENCOUNTER — Encounter (HOSPITAL_COMMUNITY)
Admission: RE | Admit: 2018-12-27 | Discharge: 2018-12-27 | Disposition: A | Discharge status: Home / Self Care | Payer: 59 | Source: Ambulatory Visit | Attending: Surgery | Admitting: Surgery

## 2018-12-27 DIAGNOSIS — Z01812 Encounter for preprocedural laboratory examination: Secondary | ICD-10-CM | POA: Insufficient documentation

## 2018-12-27 DIAGNOSIS — K601 Chronic anal fissure: Secondary | ICD-10-CM | POA: Diagnosis not present

## 2018-12-27 DIAGNOSIS — Z1159 Encounter for screening for other viral diseases: Secondary | ICD-10-CM | POA: Insufficient documentation

## 2018-12-27 DIAGNOSIS — Z79899 Other long term (current) drug therapy: Secondary | ICD-10-CM | POA: Diagnosis not present

## 2018-12-27 DIAGNOSIS — K620 Anal polyp: Secondary | ICD-10-CM | POA: Diagnosis not present

## 2018-12-27 DIAGNOSIS — K644 Residual hemorrhoidal skin tags: Secondary | ICD-10-CM | POA: Diagnosis not present

## 2018-12-27 DIAGNOSIS — K581 Irritable bowel syndrome with constipation: Secondary | ICD-10-CM | POA: Diagnosis not present

## 2018-12-27 DIAGNOSIS — K259 Gastric ulcer, unspecified as acute or chronic, without hemorrhage or perforation: Secondary | ICD-10-CM | POA: Diagnosis not present

## 2018-12-27 DIAGNOSIS — K640 First degree hemorrhoids: Secondary | ICD-10-CM | POA: Diagnosis present

## 2018-12-27 DIAGNOSIS — K629 Disease of anus and rectum, unspecified: Secondary | ICD-10-CM | POA: Diagnosis not present

## 2018-12-27 DIAGNOSIS — K643 Fourth degree hemorrhoids: Secondary | ICD-10-CM | POA: Diagnosis not present

## 2018-12-27 DIAGNOSIS — F17210 Nicotine dependence, cigarettes, uncomplicated: Secondary | ICD-10-CM | POA: Diagnosis not present

## 2018-12-27 DIAGNOSIS — K219 Gastro-esophageal reflux disease without esophagitis: Secondary | ICD-10-CM | POA: Diagnosis not present

## 2018-12-27 LAB — CBC
HCT: 41.8 % (ref 36.0–46.0)
Hemoglobin: 13.7 g/dL (ref 12.0–15.0)
MCH: 32.2 pg (ref 26.0–34.0)
MCHC: 32.8 g/dL (ref 30.0–36.0)
MCV: 98.4 fL (ref 80.0–100.0)
Platelets: 296 10*3/uL (ref 150–400)
RBC: 4.25 MIL/uL (ref 3.87–5.11)
RDW: 12.4 % (ref 11.5–15.5)
WBC: 11.2 10*3/uL — ABNORMAL HIGH (ref 4.0–10.5)
nRBC: 0 % (ref 0.0–0.2)

## 2018-12-27 MED ORDER — CHLORHEXIDINE GLUCONATE CLOTH 2 % EX PADS
6.0000 | MEDICATED_PAD | Freq: Once | CUTANEOUS | Status: DC
  Filled 2018-12-27: qty 6

## 2018-12-28 LAB — NOVEL CORONAVIRUS, NAA (HOSP ORDER, SEND-OUT TO REF LAB; TAT 18-24 HRS): SARS-CoV-2, NAA: NOT DETECTED

## 2018-12-29 MED ORDER — BUPIVACAINE LIPOSOME 1.3 % IJ SUSP
20.0000 mL | Freq: Once | INTRAMUSCULAR | Status: DC
  Filled 2018-12-29: qty 20

## 2018-12-29 NOTE — Progress Notes (Signed)
SPOKE W/  _Patient     SCREENING SYMPTOMS OF COVID 19:   COUGH--no  RUNNY NOSE--- no  SORE THROAT---no  NASAL CONGESTION----no  SNEEZING----no  SHORTNESS OF BREATH---no  DIFFICULTY BREATHING---no  TEMP >100.0 -----no  UNEXPLAINED BODY ACHES------no  CHILLS -------- no  HEADACHES ---------no  LOSS OF SMELL/ TASTE --------no    HAVE YOU OR ANY FAMILY MEMBER TRAVELLED PAST 14 DAYS OUT OF THE   COUNTY---no STATE----no COUNTRY----no  HAVE YOU OR ANY FAMILY MEMBER BEEN EXPOSED TO ANYONE WITH COVID 19? no    

## 2018-12-29 NOTE — Anesthesia Preprocedure Evaluation (Addendum)
Anesthesia Evaluation  Patient identified by MRN, date of birth, ID band Patient awake    Reviewed: Allergy & Precautions, NPO status , Patient's Chart, lab work & pertinent test results  History of Anesthesia Complications Negative for: history of anesthetic complications  Airway Mallampati: II  TM Distance: >3 FB Neck ROM: Full    Dental no notable dental hx. (+) Dental Advisory Given   Pulmonary neg pulmonary ROS, Current Smoker,    Pulmonary exam normal        Cardiovascular negative cardio ROS Normal cardiovascular exam     Neuro/Psych  Headaches,    GI/Hepatic Neg liver ROS, GERD  ,  Endo/Other  negative endocrine ROS  Renal/GU negative Renal ROS     Musculoskeletal negative musculoskeletal ROS (+)   Abdominal   Peds  Hematology negative hematology ROS (+)   Anesthesia Other Findings Day of surgery medications reviewed with the patient.  Reproductive/Obstetrics                            Anesthesia Physical Anesthesia Plan  ASA: II  Anesthesia Plan: General   Post-op Pain Management:    Induction: Intravenous  PONV Risk Score and Plan: 3 and Ondansetron, Dexamethasone and Scopolamine patch - Pre-op  Airway Management Planned: Oral ETT  Additional Equipment:   Intra-op Plan:   Post-operative Plan: Extubation in OR  Informed Consent: I have reviewed the patients History and Physical, chart, labs and discussed the procedure including the risks, benefits and alternatives for the proposed anesthesia with the patient or authorized representative who has indicated his/her understanding and acceptance.     Dental advisory given  Plan Discussed with: CRNA and Anesthesiologist  Anesthesia Plan Comments:        Anesthesia Quick Evaluation

## 2018-12-30 ENCOUNTER — Ambulatory Visit (HOSPITAL_COMMUNITY): Payer: 59 | Admitting: Physician Assistant

## 2018-12-30 ENCOUNTER — Encounter (HOSPITAL_COMMUNITY)
Admission: RE | Disposition: A | Discharge status: Home / Self Care | Payer: Self-pay | Source: Home / Self Care | Attending: Surgery

## 2018-12-30 ENCOUNTER — Ambulatory Visit (HOSPITAL_COMMUNITY): Payer: 59 | Admitting: Anesthesiology

## 2018-12-30 ENCOUNTER — Other Ambulatory Visit: Payer: Self-pay

## 2018-12-30 ENCOUNTER — Encounter (HOSPITAL_COMMUNITY): Payer: Self-pay | Admitting: *Deleted

## 2018-12-30 ENCOUNTER — Ambulatory Visit (HOSPITAL_COMMUNITY)
Admission: RE | Admit: 2018-12-30 | Discharge: 2018-12-30 | Disposition: A | Discharge status: Home / Self Care | Payer: 59 | Attending: Surgery | Admitting: Surgery

## 2018-12-30 DIAGNOSIS — K601 Chronic anal fissure: Secondary | ICD-10-CM | POA: Insufficient documentation

## 2018-12-30 DIAGNOSIS — Z79899 Other long term (current) drug therapy: Secondary | ICD-10-CM | POA: Insufficient documentation

## 2018-12-30 DIAGNOSIS — F17210 Nicotine dependence, cigarettes, uncomplicated: Secondary | ICD-10-CM | POA: Insufficient documentation

## 2018-12-30 DIAGNOSIS — K602 Anal fissure, unspecified: Secondary | ICD-10-CM

## 2018-12-30 DIAGNOSIS — K219 Gastro-esophageal reflux disease without esophagitis: Secondary | ICD-10-CM | POA: Insufficient documentation

## 2018-12-30 DIAGNOSIS — K259 Gastric ulcer, unspecified as acute or chronic, without hemorrhage or perforation: Secondary | ICD-10-CM | POA: Insufficient documentation

## 2018-12-30 DIAGNOSIS — K629 Disease of anus and rectum, unspecified: Secondary | ICD-10-CM | POA: Insufficient documentation

## 2018-12-30 DIAGNOSIS — K644 Residual hemorrhoidal skin tags: Secondary | ICD-10-CM | POA: Insufficient documentation

## 2018-12-30 DIAGNOSIS — K581 Irritable bowel syndrome with constipation: Secondary | ICD-10-CM | POA: Insufficient documentation

## 2018-12-30 DIAGNOSIS — K643 Fourth degree hemorrhoids: Secondary | ICD-10-CM | POA: Diagnosis not present

## 2018-12-30 DIAGNOSIS — K620 Anal polyp: Secondary | ICD-10-CM | POA: Insufficient documentation

## 2018-12-30 DIAGNOSIS — Z1159 Encounter for screening for other viral diseases: Secondary | ICD-10-CM | POA: Insufficient documentation

## 2018-12-30 HISTORY — PX: SPHINCTEROTOMY: SHX5279

## 2018-12-30 SURGERY — SPHINCTEROTOMY, ANAL
Anesthesia: General | Site: Rectum

## 2018-12-30 MED ORDER — PROPOFOL 10 MG/ML IV BOLUS
INTRAVENOUS | Status: DC | PRN
  Administered 2018-12-30: 200 mg via INTRAVENOUS

## 2018-12-30 MED ORDER — MIDAZOLAM HCL 2 MG/2ML IJ SOLN
INTRAMUSCULAR | Status: AC
  Filled 2018-12-30: qty 2

## 2018-12-30 MED ORDER — LIDOCAINE 2% (20 MG/ML) 5 ML SYRINGE
INTRAMUSCULAR | Status: DC | PRN
  Administered 2018-12-30: 1.5 mg/kg/h via INTRAVENOUS

## 2018-12-30 MED ORDER — FENTANYL CITRATE (PF) 100 MCG/2ML IJ SOLN
INTRAMUSCULAR | Status: DC | PRN
  Administered 2018-12-30: 50 ug via INTRAVENOUS
  Administered 2018-12-30: 100 ug via INTRAVENOUS
  Administered 2018-12-30 (×2): 50 ug via INTRAVENOUS
  Administered 2018-12-30: 100 ug via INTRAVENOUS
  Administered 2018-12-30: 50 ug via INTRAVENOUS

## 2018-12-30 MED ORDER — SUCCINYLCHOLINE CHLORIDE 200 MG/10ML IV SOSY
PREFILLED_SYRINGE | INTRAVENOUS | Status: DC | PRN
  Administered 2018-12-30: 140 mg via INTRAVENOUS

## 2018-12-30 MED ORDER — BUPIVACAINE LIPOSOME 1.3 % IJ SUSP
INTRAMUSCULAR | Status: DC | PRN
  Administered 2018-12-30: 20 mL

## 2018-12-30 MED ORDER — PROMETHAZINE HCL 25 MG/ML IJ SOLN
6.2500 mg | INTRAMUSCULAR | Status: AC | PRN
  Administered 2018-12-30 (×2): 6.25 mg via INTRAVENOUS

## 2018-12-30 MED ORDER — ONDANSETRON HCL 4 MG/2ML IJ SOLN
INTRAMUSCULAR | Status: DC | PRN
  Administered 2018-12-30: 4 mg via INTRAVENOUS

## 2018-12-30 MED ORDER — LIDOCAINE 2% (20 MG/ML) 5 ML SYRINGE
INTRAMUSCULAR | Status: DC | PRN
  Administered 2018-12-30: 75 mg via INTRAVENOUS
  Administered 2018-12-30: 25 mg via INTRAVENOUS

## 2018-12-30 MED ORDER — LIDOCAINE HCL 2 % IJ SOLN
INTRAMUSCULAR | Status: AC
  Filled 2018-12-30: qty 20

## 2018-12-30 MED ORDER — DIBUCAINE (PERIANAL) 1 % EX OINT
TOPICAL_OINTMENT | CUTANEOUS | Status: DC | PRN
  Administered 2018-12-30: 1 via RECTAL

## 2018-12-30 MED ORDER — ONDANSETRON HCL 4 MG/2ML IJ SOLN
INTRAMUSCULAR | Status: AC
  Filled 2018-12-30: qty 2

## 2018-12-30 MED ORDER — DIBUCAINE (PERIANAL) 1 % EX OINT
TOPICAL_OINTMENT | CUTANEOUS | Status: AC
  Filled 2018-12-30: qty 28

## 2018-12-30 MED ORDER — PROMETHAZINE HCL 25 MG/ML IJ SOLN
INTRAMUSCULAR | Status: AC
  Administered 2018-12-30: 6.25 mg via INTRAVENOUS
  Filled 2018-12-30: qty 1

## 2018-12-30 MED ORDER — 0.9 % SODIUM CHLORIDE (POUR BTL) OPTIME
TOPICAL | Status: DC | PRN
  Administered 2018-12-30: 1000 mL

## 2018-12-30 MED ORDER — CELECOXIB 200 MG PO CAPS
200.0000 mg | ORAL_CAPSULE | ORAL | Status: AC
  Administered 2018-12-30: 06:00:00 200 mg via ORAL
  Filled 2018-12-30: qty 1

## 2018-12-30 MED ORDER — METRONIDAZOLE IN NACL 5-0.79 MG/ML-% IV SOLN
500.0000 mg | INTRAVENOUS | Status: AC
  Administered 2018-12-30: 500 mg via INTRAVENOUS
  Filled 2018-12-30: qty 100

## 2018-12-30 MED ORDER — HYDROCODONE-ACETAMINOPHEN 5-325 MG PO TABS: 1.0000 | ORAL_TABLET | ORAL | 0 refills | Status: DC | PRN

## 2018-12-30 MED ORDER — KETAMINE HCL 10 MG/ML IJ SOLN
INTRAMUSCULAR | Status: DC | PRN
  Administered 2018-12-30: 30 mg via INTRAVENOUS

## 2018-12-30 MED ORDER — KETAMINE HCL 10 MG/ML IJ SOLN
INTRAMUSCULAR | Status: AC
  Filled 2018-12-30: qty 1

## 2018-12-30 MED ORDER — LABETALOL HCL 5 MG/ML IV SOLN
INTRAVENOUS | Status: DC | PRN
  Administered 2018-12-30: 2.5 mg via INTRAVENOUS
  Administered 2018-12-30: 5 mg via INTRAVENOUS

## 2018-12-30 MED ORDER — SCOPOLAMINE 1 MG/3DAYS TD PT72
1.0000 | MEDICATED_PATCH | TRANSDERMAL | Status: DC
  Administered 2018-12-30: 1.5 mg via TRANSDERMAL
  Filled 2018-12-30: qty 1

## 2018-12-30 MED ORDER — FENTANYL CITRATE (PF) 100 MCG/2ML IJ SOLN
INTRAMUSCULAR | Status: AC
  Filled 2018-12-30: qty 2

## 2018-12-30 MED ORDER — DEXAMETHASONE SODIUM PHOSPHATE 10 MG/ML IJ SOLN
INTRAMUSCULAR | Status: AC
  Filled 2018-12-30: qty 1

## 2018-12-30 MED ORDER — GABAPENTIN 300 MG PO CAPS
300.0000 mg | ORAL_CAPSULE | ORAL | Status: AC
  Administered 2018-12-30: 06:00:00 300 mg via ORAL
  Filled 2018-12-30: qty 1

## 2018-12-30 MED ORDER — FENTANYL CITRATE (PF) 250 MCG/5ML IJ SOLN
INTRAMUSCULAR | Status: AC
  Filled 2018-12-30: qty 5

## 2018-12-30 MED ORDER — BUPIVACAINE-EPINEPHRINE (PF) 0.25% -1:200000 IJ SOLN
INTRAMUSCULAR | Status: AC
  Filled 2018-12-30: qty 30

## 2018-12-30 MED ORDER — BUPIVACAINE-EPINEPHRINE 0.25% -1:200000 IJ SOLN
INTRAMUSCULAR | Status: DC | PRN
  Administered 2018-12-30: 20 mL

## 2018-12-30 MED ORDER — FENTANYL CITRATE (PF) 100 MCG/2ML IJ SOLN: 25.0000 ug | INTRAMUSCULAR | Status: DC | PRN

## 2018-12-30 MED ORDER — DEXAMETHASONE SODIUM PHOSPHATE 10 MG/ML IJ SOLN
INTRAMUSCULAR | Status: DC | PRN
  Administered 2018-12-30: 10 mg via INTRAVENOUS

## 2018-12-30 MED ORDER — ACETAMINOPHEN 500 MG PO TABS
1000.0000 mg | ORAL_TABLET | ORAL | Status: AC
  Administered 2018-12-30: 1000 mg via ORAL
  Filled 2018-12-30: qty 2

## 2018-12-30 MED ORDER — PROPOFOL 10 MG/ML IV BOLUS
INTRAVENOUS | Status: AC
  Filled 2018-12-30: qty 20

## 2018-12-30 MED ORDER — CEFAZOLIN SODIUM-DEXTROSE 2-4 GM/100ML-% IV SOLN
2.0000 g | INTRAVENOUS | Status: AC
  Administered 2018-12-30: 2 g via INTRAVENOUS
  Filled 2018-12-30: qty 100

## 2018-12-30 MED ORDER — LACTATED RINGERS IV SOLN
INTRAVENOUS | Status: DC
  Administered 2018-12-30: 06:00:00 via INTRAVENOUS

## 2018-12-30 MED ORDER — MIDAZOLAM HCL 5 MG/5ML IJ SOLN
INTRAMUSCULAR | Status: DC | PRN
  Administered 2018-12-30: 2 mg via INTRAVENOUS

## 2018-12-30 SURGICAL SUPPLY — 37 items
BLADE SURG 15 STRL LF DISP TIS (BLADE) ×1 IMPLANT
BLADE SURG 15 STRL SS (BLADE) ×3
BRIEF STRETCH FOR OB PAD LRG (UNDERPADS AND DIAPERS) ×3 IMPLANT
COVER SURGICAL LIGHT HANDLE (MISCELLANEOUS) ×3 IMPLANT
COVER WAND RF STERILE (DRAPES) IMPLANT
DRAPE LAPAROTOMY T 102X78X121 (DRAPES) ×3 IMPLANT
DRSG PAD ABDOMINAL 8X10 ST (GAUZE/BANDAGES/DRESSINGS) ×3 IMPLANT
ELECT PENCIL ROCKER SW 15FT (MISCELLANEOUS) ×3 IMPLANT
ELECT REM PT RETURN 15FT ADLT (MISCELLANEOUS) ×3 IMPLANT
GAUZE 4X4 16PLY RFD (DISPOSABLE) ×3 IMPLANT
GAUZE SPONGE 4X4 12PLY STRL (GAUZE/BANDAGES/DRESSINGS) ×3 IMPLANT
GLOVE BIO SURGEON STRL SZ 6.5 (GLOVE) ×1 IMPLANT
GLOVE BIO SURGEONS STRL SZ 6.5 (GLOVE) ×1
GLOVE BIOGEL PI IND STRL 6.5 (GLOVE) IMPLANT
GLOVE BIOGEL PI INDICATOR 6.5 (GLOVE) ×2
GLOVE ECLIPSE 8.0 STRL XLNG CF (GLOVE) ×3 IMPLANT
GLOVE INDICATOR 8.0 STRL GRN (GLOVE) ×3 IMPLANT
GLOVE SURG SS PI 7.0 STRL IVOR (GLOVE) ×2 IMPLANT
GOWN STRL REUS W/TWL XL LVL3 (GOWN DISPOSABLE) ×6 IMPLANT
KIT BASIN OR (CUSTOM PROCEDURE TRAY) ×3 IMPLANT
KIT TURNOVER KIT A (KITS) ×2 IMPLANT
NEEDLE HYPO 22GX1.5 SAFETY (NEEDLE) ×3 IMPLANT
PACK BASIC VI WITH GOWN DISP (CUSTOM PROCEDURE TRAY) ×3 IMPLANT
SUCTION FRAZIER HANDLE 12FR (TUBING)
SUCTION TUBE FRAZIER 12FR DISP (TUBING) IMPLANT
SURGILUBE 2OZ TUBE FLIPTOP (MISCELLANEOUS) ×3 IMPLANT
SUT CHROMIC 2 0 SH (SUTURE) ×2 IMPLANT
SUT CHROMIC 3 0 SH 27 (SUTURE) IMPLANT
SUT VIC AB 2-0 UR6 27 (SUTURE) ×12 IMPLANT
SWAB COLLECTION DEVICE MRSA (MISCELLANEOUS) IMPLANT
SWAB CULTURE ESWAB REG 1ML (MISCELLANEOUS) IMPLANT
SYR 20CC LL (SYRINGE) ×3 IMPLANT
SYR 3ML LL SCALE MARK (SYRINGE) IMPLANT
SYR BULB IRRIGATION 50ML (SYRINGE) ×2 IMPLANT
TOWEL OR 17X26 10 PK STRL BLUE (TOWEL DISPOSABLE) ×3 IMPLANT
TOWEL OR NON WOVEN STRL DISP B (DISPOSABLE) ×3 IMPLANT
YANKAUER SUCT BULB TIP 10FT TU (MISCELLANEOUS) ×3 IMPLANT

## 2018-12-30 NOTE — Op Note (Signed)
12/30/2018  8:36 AM  PATIENT:  [name]  38 y.o. female  Patient Care Team: Waynette ButteryMaurer, Amy K, PA-C as PCP - General (Internal Medicine) Jena Gaussourk, Gerrit Friendsobert M, MD as Consulting Physician (Gastroenterology) Karie SodaGross, Hayle Parisi, MD as Consulting Physician (General Surgery) Charna ElizabethMann, Jyothi, MD as Consulting Physician (Gastroenterology)  PRE-OPERATIVE DIAGNOSIS:   ANAL FISSURE REFRACTORY TO MEDICAL MANAGEMENT GRADE 2 & 3 INTERNAL HEMORRHOIDS EXTERNAL HEMORRHOIDS  POST-OPERATIVE DIAGNOSIS:   ANAL FISSURE REFRACTORY TO MEDICAL MANAGEMENT GRADE 2 & 4 INTERNAL HEMORRHOIDS EXTERNAL HEMORRHOIDS ANAL CANAL POLYP  PROCEDURE:  ANAL SPHINCTEROTOMY  ANORECTAL EXAM UNDER ANESTHESIA  HEMORRHOID LIGATION/PEXY EXCISION ANAL CANAL POLYP HEMORRHOIDECTOMY x 2  SURGEON:  Ardeth SportsmanSteven C. Licia Harl, MD  ASSISTANT: OR Staff   ANESTHESIA:   General Anorectal & Local field block (0.25% bupivacaine with epinephrine mixed with Liposomal bupivacaine (Experel)   EBL:  Total I/O In: -  Out: 50 [Blood:50]  Delay start of Pharmacological VTE agent (>24hrs) due to surgical blood loss or risk of bleeding:  no  DRAINS: none   SPECIMEN: Right anterior internal/external hemorrhoid. Left posterior anal canal polyp External hemorrhoid tags (not sent)  DISPOSITION OF SPECIMEN:  PATHOLOGY  COUNTS:  YES  PLAN OF CARE: Discharge to home after PACU  PATIENT DISPOSITION:  PACU - hemodynamically stable.  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  Irritating external hemorrhoids with at least one grade 3 intermittently prolapsing hemorrhoid as well.   I recommended examination and surgical treatment:  The anatomy & physiology of the anorectal region was discussed.  The pathophysiology of anal fissure and differential diagnosis was discussed.  Natural history progression  was discussed.   I stressed the importance of a bowel regimen to have daily soft bowel movements  to minimize progression of disease.     The patient's condition is not adequately controlled.  Non-operative treatment has not healed the fissure.  Therefore, I recommended examination under anesthesia for better examination to confirm the diagnosis and treat by lateral internal sphincterotomy to relax the spasm better & allow the fissure to heal.  Technique, benefits, alternatives were discussed.   I noted a good likelihood this will help address the problem.  Risks such as bleeding, pain, incontinence, recurrence, heart attack, death, and other risks were discussed.    Educational handouts further explaining the pathology, treatment options, and bowel regimen were given as well.  The patient expressed understanding & wishes to proceed with surgery.  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    Sphincterotomy location:  Right lateral anal canal.  60% distal internal sphincterotomy performed  IDESCRIPTION:   Informed consent was confirmed. Patient underwent general anesthesia without difficulty. Patient was placed into prone positioning.  The perianal region was prepped and draped in sterile fashion. Surgical timeout confirmed or plan.  I did digital rectal examination and then transitioned over to anoscopy to get a sense of the anatomy.  I identified an anal fissure in the posterior midline anal canal.  The sphincter tone was increased.  No stricture.  No abscess located.  No fistula.  Right anterior chronically prolapsed hemorrhoid grade 4.  Left lateral and left posterior lateral external hemorrhoid tags as well  I went ahead and proceeded with internal sphincterotomy technique.  I placed a 2-0 Vicryl suture in a figure-of-eight fashion 6 cm proximal to the anal verge along the right lateral hemorrhoidal canal.  I ran that more distally intermittently tying  for a hemorrhoid ligation and pexy.  Just above the anal sphincters, I excised through  the anoderm of the left lateral anal canal longitudinally.  I identified the internal and external sphincters.  Elevated the internal sphincter.  I proceeded with a partial internal sphincterotomy starting distally and moving proximally using cautery.  This involved the distal 60% thickness.  This provided improved relaxation of the anal sphincter.  I ran the Vicryl suture down to clover up the site of the sphincterotomy and tied that down.  I then proceeded to do hemorrhoidal ligation pexy on the other 5 columns in a similar fashion.  I tied this down over a Parks retractor or large Hill-Ferguson retractor to avoid narrowing.  Patient still had significant external hemorrhoidal tissue left posterior and right anterior.  This was carefully trimmed off longitudinally.  I closed the anal canal wounds at the right lateral and right internal locations with horizontal mattress interrupted sutures to good result, leaving a 5 mm distal opening to allow drainage.  Hemostasis was excellent.  I reexamined the anal canal.   There is was no narrowing.  Hemostasis was excellent.  I repeated anoscopy and examination.  Hemostasis was good.  Patient is being extubated go to recovery room.  I discussed operative findings, updated the patient's status, discussed probable steps to recovery, and gave postoperative recommendations to the patient's spouse.  Recommendations were made.  Questions were answered.  He expressed understanding & appreciation.    Adin Hector, M.D., F.A.C.S. Gastrointestinal and Minimally Invasive Surgery [facility] [address], [place], [place] [address] [phone] Main / Paging

## 2018-12-30 NOTE — Transfer of Care (Signed)
Immediate Anesthesia Transfer of Care Note  Patient: [name]  Procedure(s) Performed: ANAL SPHINCTEROTOMY ANORECTAL EXAM UNDER ANESTHESIA WITH HEMORRHOIDECTOMY (N/A Rectum)  Patient Location: PACU  Anesthesia Type:General  Level of Consciousness: awake, alert , oriented and patient cooperative  Airway & Oxygen Therapy: Patient Spontanous Breathing and Patient connected to face mask oxygen  Post-op Assessment: Report given to RN, Post -op Vital signs reviewed and stable and Patient moving all extremities X 4  Post vital signs: stable  Last Vitals:  Vitals Value Taken Time  BP 128/79 12/30/18 0847  Temp    Pulse 73 12/30/18 0852  Resp 15 12/30/18 0852  SpO2 100 % 12/30/18 0852  Vitals shown include unvalidated device data.  Last Pain:  Vitals:   12/30/18 0550  TempSrc:   PainSc: 0-No pain         Complications: No apparent anesthesia complications

## 2018-12-30 NOTE — Interval H&P Note (Signed)
History and Physical Interval Note:  12/30/2018 7:09 AM  [name]  has presented today for surgery, with the diagnosis of [place].  The various methods of treatment have been discussed with the patient and family. After consideration of risks, benefits and other options for treatment, the patient has consented to  Procedure(s): [place] (N/A) as a surgical intervention.  The patient's history has been reviewed, patient examined, no change in status, stable for surgery.  I have reviewed the patient's chart and labs.  Questions were answered to the patient's satisfaction.    I have re-reviewed the the patient's records, history, medications, and allergies.  I have re-examined the patient.  I again discussed intraoperative plans and goals of post-operative recovery.  The patient agrees to proceed.  BO ROGUE  07/12/1981 [id number]  Patient Care Team: Cloria Spring as PCP - General (Internal Medicine) Gala Romney Cristopher Estimable, MD as Consulting Physician (Gastroenterology) Michael Boston, MD as Consulting Physician (General Surgery) Juanita Craver, MD as Consulting Physician (Gastroenterology)  Patient Active Problem List   Diagnosis Date Noted  . Gastric ulcer 11/01/2014  . Dyspepsia 03/27/2014  . EXTERNAL HEMORRHOIDS, THROMBOSED 09/14/2009  . DIVERTICULOSIS, COLON 09/14/2009  . MIGRAINE, CHRONIC 08/09/2009  . GERD 08/09/2009  . CONSTIPATION, CHRONIC 08/09/2009    Past Medical History:  Diagnosis Date  . Constipation   . Hemorrhoid     Past Surgical History:  Procedure Laterality Date  . APPENDECTOMY    . COLONOSCOPY  08/16/2009   Dr. Rourk:friable anal canal otherwise normal/scattered left-sided diverticula  . ENDOMETRIAL ABLATION    . ESOPHAGOGASTRODUODENOSCOPY N/A 03/28/2014   RMR: gastric ulcer. Gastric erosions-status post gastric biopsy  . right hip surgery       Social History   Socioeconomic History  . Marital status: Married    Spouse name: Not on file  . Number of children: Not on file  . Years of education: Not on file  . Highest education level: Not on file  Occupational History  . Not on file  Social Needs  . Financial resource strain: Not on file  . Food insecurity    Worry: Not on file    Inability: Not on file  . Transportation needs    Medical: Not on file    Non-medical: Not on file  Tobacco Use  . Smoking status: Current Every Day Smoker    Packs/day: 0.50    Types: Cigarettes  . Smokeless tobacco: Never Used  Substance and Sexual Activity  . Alcohol use: Yes    Alcohol/week: 0.0 standard drinks    Comment: occasional/rarely  . Drug use: No    Types: Marijuana    Comment: in past  . Sexual activity: Not on file  Lifestyle  . Physical activity    Days per week: Not on file    Minutes per session: Not on file  . Stress: Not on file  Relationships  . Social Herbalist on phone: Not on file    Gets together: Not on file    Attends religious service: Not on file    Active member of club or organization: Not on file    Attends meetings of clubs or organizations: Not on file    Relationship status: Not on file  . Intimate partner violence    Fear of current or ex partner: Not on file    Emotionally abused: Not on file  Physically abused: Not on file    Forced sexual activity: Not on file  Other Topics Concern  . Not on file  Social History Narrative  . Not on file    Family History  Problem Relation Age of Onset  . Colon cancer Neg Hx     Medications Prior to Admission  Medication Sig Dispense Refill Last Dose  . esomeprazole (NEXIUM) 40 MG capsule Take 40 mg by mouth daily at 12 noon.   12/29/2018 at Unknown time  . polyethylene glycol (MIRALAX / GLYCOLAX) 17 g packet Take 17 g by mouth daily.    12/29/2018 at Unknown time  . hydrocortisone (ANUSOL-HC) 25 MG suppository Place 1 suppository  (25 mg total) rectally every 12 (twelve) hours. (Patient not taking: Reported on 11/01/2014) 12 suppository 1 Not Taking at Unknown time  . Linaclotide (LINZESS) 290 MCG CAPS capsule Take 1 capsule (290 mcg total) by mouth daily. (Patient not taking: Reported on 12/22/2018) 30 capsule 5 Not Taking at Unknown time  . meloxicam (MOBIC) 15 MG tablet Take 1 tablet (15 mg total) by mouth daily. Take with a meal. (Patient not taking: Reported on 12/22/2018) 2 tablet 0 Not Taking at Unknown time  . ondansetron (ZOFRAN) 4 MG tablet Take 1 tablet (4 mg total) by mouth every 8 (eight) hours as needed for nausea or vomiting. (Patient not taking: Reported on 11/01/2014) 20 tablet 0 Not Taking at Unknown time  . pantoprazole (PROTONIX) 40 MG tablet Take 1 tablet (40 mg total) by mouth daily. Take 30 minutes before breakfast (Patient not taking: Reported on 12/22/2018) 30 tablet 5 Not Taking at Unknown time  . sucralfate (CARAFATE) 1 GM/10ML suspension Take 10 mLs (1 g total) by mouth 4 (four) times daily. (Patient not taking: Reported on 11/01/2014) 420 mL 1 Not Taking at Unknown time  . [DISCONTINUED] HYDROcodone-acetaminophen (NORCO/VICODIN) 5-325 MG tablet Take 1-2 tablets by mouth every 4 (four) hours as needed. 6 tablet 0     Current Facility-Administered Medications  Medication Dose Route Frequency Provider Last Rate Last Dose  . bupivacaine liposome (EXPAREL) 1.3 % injection 266 mg  20 mL Infiltration Once Karie SodaGross, Kamaree Wheatley, MD      . ceFAZolin (ANCEF) IVPB 2g/100 mL premix  2 g Intravenous On Call to OR Karie SodaGross, Novah Nessel, MD       And  . metroNIDAZOLE (FLAGYL) IVPB 500 mg  500 mg Intravenous On Call to OR Karie SodaGross, Omah Dewalt, MD      . lactated ringers infusion   Intravenous Continuous Jairo BenJackson, Carswell, MD 100 mL/hr at 12/30/18 0557    . scopolamine (TRANSDERM-SCOP) 1 MG/3DAYS 1.5 mg  1 patch Transdermal Q72H Heather RobertsSinger, James, MD   1.5 mg at 12/30/18 0600     Allergies  Allergen Reactions  . Doxycycline Nausea And Vomiting     BP 117/82   Pulse 72   Temp (!) 97.4 F (36.3 C) (Oral)   Resp 16   SpO2 97%   Labs: No results found for this or any previous visit (from the past 48 hour(s)).  Imaging / Studies: No results found.   [name].Derrek Puff C. Jaymz Traywick, M.D., F.A.C.S. Gastrointestinal and Minimally Invasive Surgery [facility] [address] [place], [place] [address] [phone] Main / Paging  12/30/2018 7:09 AM    Ardeth SportsmanSteven C Rielyn Krupinski

## 2018-12-30 NOTE — Anesthesia Procedure Notes (Addendum)
Procedure Name: Intubation Date/Time: 12/30/2018 7:20 AM Performed by: Lissa Morales, CRNA Pre-anesthesia Checklist: Patient identified, Emergency Drugs available, Suction available and Patient being monitored Patient Re-evaluated:Patient Re-evaluated prior to induction Oxygen Delivery Method: Circle system utilized Preoxygenation: Pre-oxygenation with 100% oxygen Induction Type: IV induction, Rapid sequence and Cricoid Pressure applied Laryngoscope Size: Mac and 4 Grade View: Grade I Tube type: Oral Number of attempts: 1 Airway Equipment and Method: Stylet and Oral airway Placement Confirmation: ETT inserted through vocal cords under direct vision,  positive ETCO2 and breath sounds checked- equal and bilateral Secured at: 21 cm Tube secured with: Tape Dental Injury: Teeth and Oropharynx as per pre-operative assessment

## 2018-12-30 NOTE — Discharge Instructions (Signed)
ANORECTAL SURGERY:  °POST OPERATIVE INSTRUCTIONS ° °###################################################################### ° °EAT °Start with a pureed / full liquid diet °After 24 hours, gradually transition to a high fiber diet.   ° °CONTROL PAIN °Control pain so you can tolerate bowel movements,  °walk, sleep, tolerate sneezing/coughing, and go up/down stairs. ° ° °HAVE A BOWEL MOVEMENT DAILY °Keep your bowels regular to avoid problems.   °Taking a fiber supplement every day to keep bowels soft.   °Try a laxative to override constipation. °Use an antidairrheal to slow down diarrhea.   °Call if not better after 2 tries ° °WALK °Walk an hour a day.  Control your pain to do that. °  °CALL IF YOU HAVE PROBLEMS/CONCERNS °Call if you are still struggling despite following these instructions. °Call if you have concerns not answered by these instructions ° °###################################################################### ° ° ° °1. Take your usually prescribed home medications unless otherwise directed. °2. DIET: Follow a light bland diet the first 24 hours after arrival home, such as soup, liquids, crackers, etc.  Be sure to include lots of fluids daily.  Avoid fast food or heavy meals as your are more likely to get nauseated.  Eat a low fat the next few days after surgery.   °3. PAIN CONTROL: °a. Pain is best controlled by a usual combination of three different methods TOGETHER: °i. Ice/Heat °ii. Over the counter pain medication °iii. Prescription pain medication °b. Expect swelling and discomfort in the anus/rectal area.  Warm water baths (30-60 minutes up to 6 times a day, especially after bowel meovements) will help. Use ice for the first few days to help decrease swelling and bruising, then switch to heat such as warm towels, sitz baths, warm baths, etc to help relax tight/sore spots and speed recovery.  Some people prefer to use ice alone, heat alone, alternating between ice & heat.  Experiment to what works  for you.   °c. It is helpful to take an over-the-counter pain medication continuously for the first few weeks.  Choose one of the following that works best for you: °i. Naproxen (Aleve, etc)  Two 220mg tabs twice a day °ii. Ibuprofen (Advil, etc) Three 200mg tabs four times a day (every meal & bedtime) °iii. Acetaminophen (Tylenol, etc) 500-650mg four times a day (every meal & bedtime) °d. A  prescription for pain medication (such as oxycodone, hydrocodone, etc) should be given to you upon discharge.  Take your pain medication as prescribed.  °i. If you are having problems/concerns with the prescription medicine (does not control pain, nausea, vomiting, rash, itching, etc), please call us [phone] to see if we need to switch you to a different pain medicine that will work better for you and/or control your side effect better. °ii. If you need a refill on your pain medication, please contact your pharmacy.  They will contact our office to request authorization. Prescriptions will not be filled after 5 pm or on week-ends.  If can take up to 48 hours for it to be filled & ready so avoid waiting until you are down to thel ast pill. °e. A topical cream (Dibucaine) or a prescription for a cream (such as diltiazem 2% gel) may be given to you.  Many people find relief with topical creams.  Some people find it burns too much.  Experiment.  If it helps, use it.  If it burns, don't using it. ° °Use a Sitz Bath 4-8 times a day for relief ° ° °Sitz Bath °A sitz bath   is a warm water bath taken in the sitting position that covers only the hips and buttocks. It may be used for either healing or hygiene purposes. Sitz baths are also used to relieve pain, itching, or muscle spasms. The water may contain medicine. Moist heat will help you heal and relax.  °HOME CARE INSTRUCTIONS  °Take 3 to 4 sitz baths a day. °1. Fill the bathtub half full with warm water. °2. Sit in the water and open the drain a little. °3. Turn on the warm  water to keep the tub half full. Keep the water running constantly. °4. Soak in the water for 15 to 20 minutes. °5. After the sitz bath, pat the affected area dry first. ° ° °4. KEEP YOUR BOWELS REGULAR °a. The goal is one soft bowel movement a day °b. Avoid getting constipated.  Between the surgery and the pain medications, it is common to experience some constipation.  Increasing fluid intake and taking a fiber supplement (such as Metamucil, Citrucel, FiberCon, MiraLax, etc) 2-3 times a day regularly will usually help prevent this problem from occurring.  A mild laxative (prune juice, Milk of Magnesia, MiraLax, etc) should be taken according to package directions if there are no bowel movements after 48 hours. °c. Watch out for diarrhea.  If you have many loose bowel movements, simplify your diet to bland foods & liquids for a few days.  Stop any stool softeners and decrease your fiber supplement.  Switching to mild anti-diarrheal medications (Kayopectate, Pepto Bismol) can help.  Can try an imodium/loperamide dose.  If this worsens or does not improve, please call us. ° °5. Wound Care ° °a. Remove your bandages with your first bowel movement, usually the day after surgery.  You may have packing if you had an abscess.  Let any packing or gauze fall come out.   °b. Wear an absorbent pad or soft cotton balls in your underwear as needed to catch any drainage and help keep the area  °c. Keep the area clean and dry.  Bathe / shower every day.  Keep the area clean by showering / bathing over the incision / wound.   It is okay to soak an open wound to help wash it.  Consider using a squeeze bottle filled with warm water to gently wash the anal area.  Wet wipes or showers / gentle washing after bowel movements is often less traumatic than regular toilet paper. °d. You will often notice bleeding with bowel movements.  This should slow down by the end of the first week of surgery.  Sitting on an ice pack can  help. °e. Expect some drainage.  This should slow down by the end of the first week of surgery, but you will have occasional bleeding or drainage up to a few months after surgery.  Wear an absorbent pad or soft cotton gauze in your underwear until the drainage stops. ° °6. ACTIVITIES as tolerated:   °a. You may resume regular (light) daily activities beginning the next day--such as daily self-care, walking, climbing stairs--gradually increasing activities as tolerated.  If you can walk 30 minutes without difficulty, it is safe to try more intense activity such as jogging, treadmill, bicycling, low-impact aerobics, swimming, etc. °b. Save the most intensive and strenuous activity for last such as sit-ups, heavy lifting, contact sports, etc  Refrain from any heavy lifting or straining until you are off narcotics for pain control.   °c. DO NOT PUSH THROUGH PAIN.  Let pain   be your guide: If it hurts to do something, don't do it.  Pain is your body warning you to avoid that activity for another week until the pain goes down. °d. You may drive when you are no longer taking prescription pain medication, you can comfortably sit for long periods of time, and you can safely maneuver your car and apply brakes. °e. You may have sexual intercourse when it is comfortable.  °7. FOLLOW UP in our office °a. Please call [facility] at [phone] to set up an appointment to see your surgeon in the office for a follow-up appointment approximately 2-3 weeks after your surgery. °b. Make sure that you call for this appointment the day you arrive home to ensure a convenient appointment time. ° °8. IF YOU HAVE DISABILITY OR FAMILY LEAVE FORMS, BRING THEM TO THE OFFICE FOR PROCESSING.  DO NOT GIVE THEM TO YOUR DOCTOR. ° ° ° ° ° ° ° °WHEN TO CALL US [phone]: °1. Poor pain control °2. Reactions / problems with new medications (rash/itching, nausea, etc)  °3. Fever over 101.5 F (38.5 C) °4. Inability to urinate °5. Nausea and/or  vomiting °6. Worsening swelling or bruising °7. Continued bleeding from incision. °8. Increased pain, redness, or drainage from the incision ° °The clinic staff is available to answer your questions during regular business hours (8:30am-5pm).  Please don’t hesitate to call and ask to speak to one of our nurses for clinical concerns.   A surgeon from [facility] is always on call at the hospitals °  °If you have a medical emergency, go to the nearest emergency room or call 911. °  ° °[facility] °[address], [address], [place], [place]  [address] ? °MAIN: [phone] ? TOLL FREE: [phone] ? °FAX [phone] °[email or web address] ° ° ° ° °Anal Fissure, Adult ° °An anal fissure is a small tear or crack in the tissue of the anus. Bleeding from a fissure usually stops on its own within a few minutes. However, bleeding will often occur again with each bowel movement until the fissure heals. °What are the causes? °This condition is usually caused by passing a large or hard stool (feces). Other causes include: °· Constipation. °· Frequent diarrhea. °· Inflammatory bowel disease (Crohn's disease or ulcerative colitis). °· Childbirth. °· Infections. °· Anal sex. °What are the signs or symptoms? °Symptoms of this condition include: °· Bleeding from the rectum. °· Small amounts of blood seen on your stool, on the toilet paper, or in the toilet after a bowel movement. The blood coats the outside of the stool and is not mixed with the stool. °· Painful bowel movements. °· Itching or irritation around the anus. °How is this diagnosed? °A health care provider may diagnose this condition by closely examining the anal area. An anal fissure can usually be seen with careful inspection. In some cases, a rectal exam may be performed, or a short tube (anoscope) may be used to examine the anal canal. °How is this treated? °Initial treatment for this condition may  include: °· Taking steps to avoid constipation. This may include making changes to your diet, such as increasing your intake of fiber or fluid. °· Taking fiber supplements. These supplements can soften your stool to help make bowel movements easier. Your health care provider may also prescribe a stool softener if your stool is hard. °· Taking sitz baths. This may help to heal the tear. °· Using medicated creams or ointments. These   may be prescribed to lessen discomfort. °Treatments that are sometimes used if initial treatments do not work well or if the condition is more severe may include: °· Botulinum injection. °· Surgery to repair the fissure. °Follow these instructions at home: °Eating and drinking ° °· Avoid foods that may cause constipation, such as bananas, milk, and other dairy products. °· Eat all fruits, except bananas. °· Drink enough fluid to keep your urine pale yellow. °· Eat foods that are high in fiber, such as beans, whole grains, and fresh fruits and vegetables. °General instructions ° °· Take over-the-counter and prescription medicines only as told by your health care provider. °· Use creams or ointments only as told by your health care provider. °· Keep the anal area clean and dry. °· Take sitz baths as told by your health care provider. Do not use soap in the sitz baths. °· Keep all follow-up visits as told by your health care provider. This is important. °Contact a health care provider if you have: °· More bleeding. °· A fever. °· Diarrhea that is mixed with blood. °· Pain that continues. °· Ongoing problems that are getting worse rather than better. °Summary °· An anal fissure is a small tear or crack in the tissue of the anus. This condition is usually caused by passing a large or hard stool (feces). Other causes include constipation and frequent diarrhea. °· Initial treatment for this condition may include taking steps to avoid constipation, such as increasing your intake of fiber or  fluid. °· Follow instructions for care as told by your health care provider. °· Contact your health care provider if you have more bleeding or your problem is getting worse rather than better. °· Keep all follow-up visits as told by your health care provider. This is important. °This information is not intended to replace advice given to you by your health care provider. Make sure you discuss any questions you have with your health care provider. °Document Released: 07/07/2005 Document Revised: 12/17/2017 Document Reviewed: 12/17/2017 °Elsevier Interactive Patient Education © 2019 [employer] ° °

## 2018-12-30 NOTE — Anesthesia Postprocedure Evaluation (Signed)
Anesthesia Post Note  Patient: NANDITA MATHENIA  Procedure(s) Performed: ANAL SPHINCTEROTOMY ANORECTAL EXAM UNDER ANESTHESIA WITH HEMORRHOIDECTOMY (N/A Rectum)     Patient location during evaluation: PACU Anesthesia Type: General Level of consciousness: sedated Pain management: pain level controlled Vital Signs Assessment: post-procedure vital signs reviewed and stable Respiratory status: spontaneous breathing and respiratory function stable Cardiovascular status: stable Postop Assessment: no apparent nausea or vomiting Anesthetic complications: no    Last Vitals:  Vitals:   12/30/18 0936 12/30/18 1015  BP: (!) 149/96 (!) 148/90  Pulse: 65 68  Resp: 14 16  Temp: (!) 36.3 C   SpO2: 100% 100%    Last Pain:  Vitals:   12/30/18 0846  TempSrc:   PainSc: 0-No pain                 Jshaun Abernathy DANIEL

## 2018-12-31 ENCOUNTER — Encounter (HOSPITAL_COMMUNITY): Payer: Self-pay | Admitting: Surgery

## 2021-12-25 ENCOUNTER — Other Ambulatory Visit: Payer: Self-pay | Admitting: Obstetrics & Gynecology

## 2021-12-25 DIAGNOSIS — M79621 Pain in right upper arm: Secondary | ICD-10-CM

## 2021-12-25 DIAGNOSIS — N644 Mastodynia: Secondary | ICD-10-CM

## 2022-01-01 ENCOUNTER — Ambulatory Visit
Admission: RE | Admit: 2022-01-01 | Discharge: 2022-01-01 | Disposition: A | Discharge status: Home / Self Care | Payer: 59 | Source: Ambulatory Visit | Attending: Obstetrics & Gynecology | Admitting: Obstetrics & Gynecology

## 2022-01-01 ENCOUNTER — Other Ambulatory Visit: Payer: Self-pay | Admitting: Obstetrics & Gynecology

## 2022-01-01 ENCOUNTER — Ambulatory Visit
Admission: RE | Admit: 2022-01-01 | Discharge: 2022-01-01 | Disposition: A | Discharge status: Home / Self Care | Payer: BC Managed Care – PPO | Source: Ambulatory Visit | Attending: Obstetrics & Gynecology | Admitting: Obstetrics & Gynecology

## 2022-01-01 DIAGNOSIS — N644 Mastodynia: Secondary | ICD-10-CM

## 2022-01-01 DIAGNOSIS — R2231 Localized swelling, mass and lump, right upper limb: Secondary | ICD-10-CM

## 2022-01-01 DIAGNOSIS — M79621 Pain in right upper arm: Secondary | ICD-10-CM

## 2022-01-01 DIAGNOSIS — R2232 Localized swelling, mass and lump, left upper limb: Secondary | ICD-10-CM

## 2023-04-06 ENCOUNTER — Emergency Department (HOSPITAL_BASED_OUTPATIENT_CLINIC_OR_DEPARTMENT_OTHER)
Admission: EM | Admit: 2023-04-06 | Discharge: 2023-04-06 | Disposition: A | Discharge status: Home / Self Care | Payer: BC Managed Care – PPO | Attending: Emergency Medicine | Admitting: Emergency Medicine

## 2023-04-06 ENCOUNTER — Encounter (HOSPITAL_BASED_OUTPATIENT_CLINIC_OR_DEPARTMENT_OTHER): Payer: Self-pay | Admitting: Emergency Medicine

## 2023-04-06 ENCOUNTER — Emergency Department (HOSPITAL_BASED_OUTPATIENT_CLINIC_OR_DEPARTMENT_OTHER): Payer: BC Managed Care – PPO | Admitting: Radiology

## 2023-04-06 ENCOUNTER — Other Ambulatory Visit: Payer: Self-pay

## 2023-04-06 DIAGNOSIS — M545 Low back pain, unspecified: Secondary | ICD-10-CM | POA: Insufficient documentation

## 2023-04-06 LAB — PREGNANCY, URINE: Preg Test, Ur: NEGATIVE

## 2023-04-06 MED ORDER — PREDNISONE 20 MG PO TABS: 40.0000 mg | ORAL_TABLET | Freq: Every day | ORAL | 0 refills | Status: DC

## 2023-04-06 MED ORDER — METHOCARBAMOL 500 MG PO TABS
500.0000 mg | ORAL_TABLET | Freq: Once | ORAL | Status: AC
  Administered 2023-04-06: 500 mg via ORAL
  Filled 2023-04-06: qty 1

## 2023-04-06 MED ORDER — METHOCARBAMOL 500 MG PO TABS: 500.0000 mg | ORAL_TABLET | Freq: Three times a day (TID) | ORAL | 0 refills | Status: AC | PRN

## 2023-04-06 MED ORDER — KETOROLAC TROMETHAMINE 15 MG/ML IJ SOLN
15.0000 mg | Freq: Once | INTRAMUSCULAR | Status: AC
  Administered 2023-04-06: 15 mg via INTRAMUSCULAR
  Filled 2023-04-06: qty 1

## 2023-04-06 NOTE — ED Provider Notes (Signed)
[place] EMERGENCY DEPARTMENT AT [facility] Provider Note   CSN: [id number] Arrival date & time: 04/06/23  0102     History  Chief Complaint  Patient presents with   Hip Pain    [name] is a 42 y.o. female.   Hip Pain  Patient presents with pain in the low back/hip area.  Has had some dull pain but much more severe now.  Worse with certain movements.  No loss of bladder or bowel control.  No dysuria.  No fall.  No known injury.    Past Medical History:  Diagnosis Date   Constipation    Hemorrhoid    Past Surgical History:  Procedure Laterality Date   APPENDECTOMY     COLONOSCOPY  08/16/2009   Dr. Audelia Hives anal canal otherwise normal/scattered left-sided diverticula   ENDOMETRIAL ABLATION     ESOPHAGOGASTRODUODENOSCOPY N/A 03/28/2014   RMR: gastric ulcer. Gastric erosions-status post gastric biopsy   right hip surgery     SPHINCTEROTOMY N/A 12/30/2018   Procedure: ANAL SPHINCTEROTOMY ANORECTAL EXAM UNDER ANESTHESIA WITH HEMORRHOIDECTOMY;  Surgeon: Karie Soda, MD;  Location: [facility];  Service: General;  Laterality: N/A;     Home Medications Prior to Admission medications   Medication Sig Start Date End Date Taking? Authorizing Provider  esomeprazole (NEXIUM) 40 MG capsule Take 40 mg by mouth daily at 12 noon.    [provider]  HYDROcodone-acetaminophen (NORCO/VICODIN) 5-325 MG tablet Take 1-2 tablets by mouth every 4 (four) hours as needed for severe pain. 12/30/18   Karie Soda, MD  Linaclotide Karlene Einstein) 290 MCG CAPS capsule Take 1 capsule (290 mcg total) by mouth daily. Patient not taking: Reported on 12/22/2018 11/15/14   Gelene Mink, NP  meloxicam (MOBIC) 15 MG tablet Take 1 tablet (15 mg total) by mouth daily. Take with a meal. Patient not taking: Reported on 12/22/2018 08/04/18   Ivery Quale, PA-C  polyethylene glycol (MIRALAX / GLYCOLAX) 17 g packet Take 17 g by mouth daily.  02/09/14   [provider]       Allergies    Doxycycline    Review of Systems   Review of Systems  Physical Exam Updated Vital Signs BP (!) 184/96 (BP Location: Right Arm)   Pulse 84   Temp (!) 97.4 F (36.3 C) (Oral)   Resp 20   Ht 5\' 4"  (1.626 m)   Wt 72.6 kg   SpO2 100%   BMI 27.46 kg/m  Physical Exam Vitals and nursing note reviewed.  Musculoskeletal:     Comments: Tenderness in left SI area.  Some pain with straight leg raise on left.  Neurovascular tact distally.  Also has some lumbar tenderness.  No rash.  Skin:    Capillary Refill: Capillary refill takes less than 2 seconds.  Neurological:     Mental Status: She is alert and oriented to person, place, and time.     ED Results / Procedures / Treatments   Labs (all labs ordered are listed, but only abnormal results are displayed) Labs Reviewed - No data to display  EKG None  Radiology No results found.  Procedures Procedures    Medications Ordered in ED Medications  ketorolac (TORADOL) 15 MG/ML injection 15 mg (has no administration in time range)  methocarbamol (ROBAXIN) tablet 500 mg (has no administration in time range)    ED Course/ Medical Decision Making/ A&P  Medical Decision Making Amount and/or Complexity of Data Reviewed Labs: ordered. Radiology: ordered.  Risk Prescription drug management.   Patient with left low back/posterior pelvic pain.  Reproducible.  Worse with movement appears most likely musculoskeletal.  No real red flags.  No fevers.  No injection of drugs.  Will get x-rays and symptomatic treatment.  X-rays show which she will disc disease.  Feeling better after treatment with Toradol and muscle relaxer.  Will prescribe steroids and a muscle relaxer.  No real red flags.  Follow-up with neurosurgery as needed.  Will discharge.        Final Clinical Impression(s) / ED Diagnoses Final diagnoses:  None    Rx / DC Orders ED Discharge Orders     None          Benjiman Core, MD 04/06/23 1245

## 2023-04-06 NOTE — ED Notes (Signed)
Pt aware of the need for a urine... Unable to currently provide the sample.Marland KitchenMarland Kitchen

## 2023-04-06 NOTE — ED Triage Notes (Signed)
Pt arrived POV from home c/o L hip pain described as "sharp" stating "it feels like the bone." Pt denies any recent trauma/injury, cannot correlate any specific movements with onset of pain. No hx with L hip.

## 2023-04-06 NOTE — ED Notes (Signed)
Discharge paperwork given and verbally understood. 

## 2023-10-01 IMAGING — US US AXILLARY RIGHT
1 series · 6 of 6 positions shown · non-contrast
Comparison: Previous exam(s).

CLINICAL DATA: 40-year-old female presenting for evaluation of
spontaneous bilateral milky nipple discharge for 2 months. She also
has bilateral palpable axillary lumps which she first noted in
Friday June, 2019 to July 2021. She has family history of breast
cancer in her maternal aunt in her 30s and a maternal cousin at age
26.

EXAM:
DIGITAL DIAGNOSTIC BILATERAL MAMMOGRAM WITH TOMOSYNTHESIS AND CAD;
US AXILLARY RIGHT; US AXILLARY LEFT
TECHNIQUE: Bilateral digital diagnostic mammography and breast tomosynthesis
was performed. The images were evaluated with computer-aided
detection.; Targeted ultrasound examination of the right axilla was
performed.; Targeted ultrasound examination of the left axilla was
performed.

[Series 1: us axillary right · 0.07mm/px · 6 of 6 slices shown]
[im 1/6]
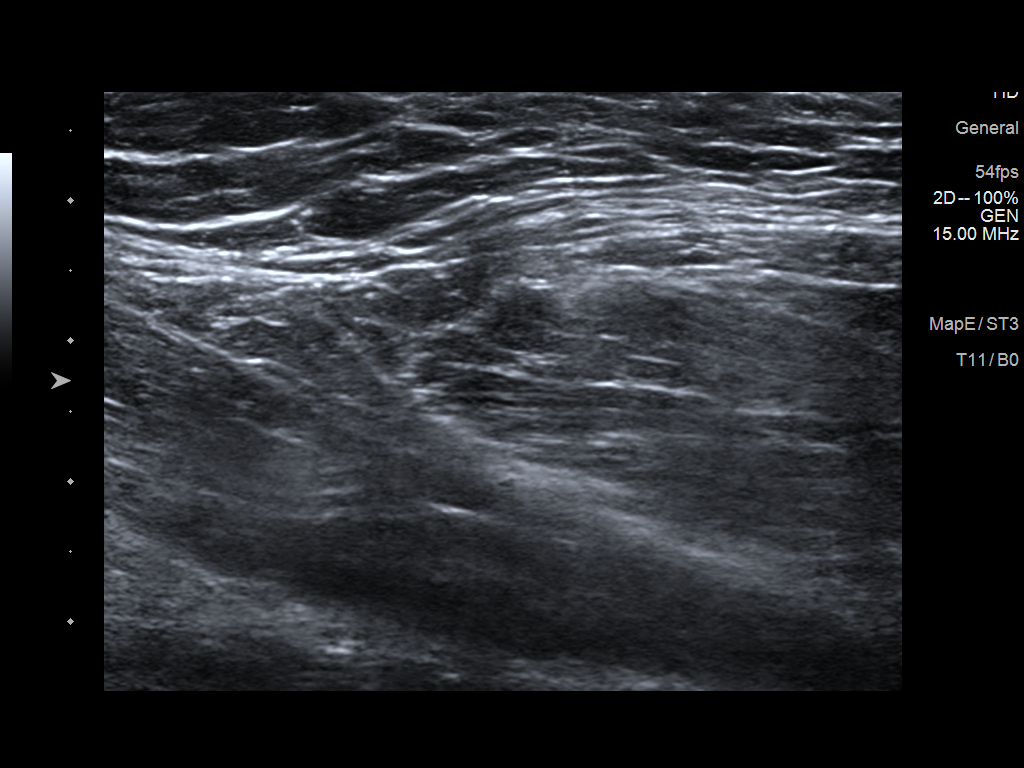
[im 2/6]
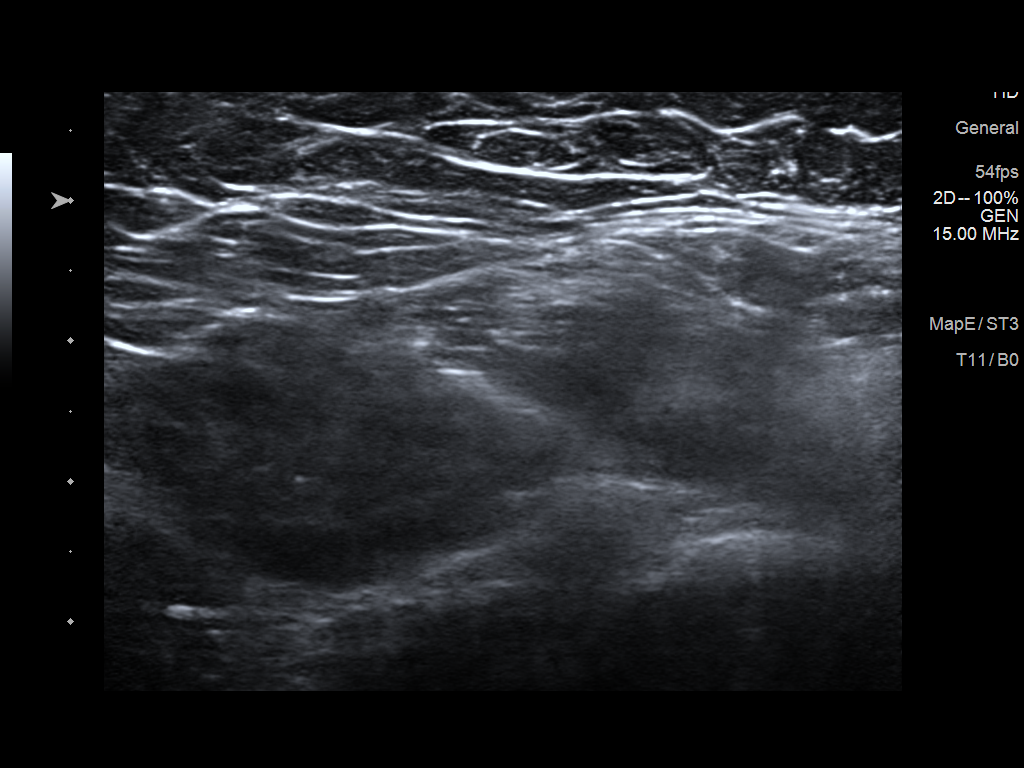
[im 3/6]
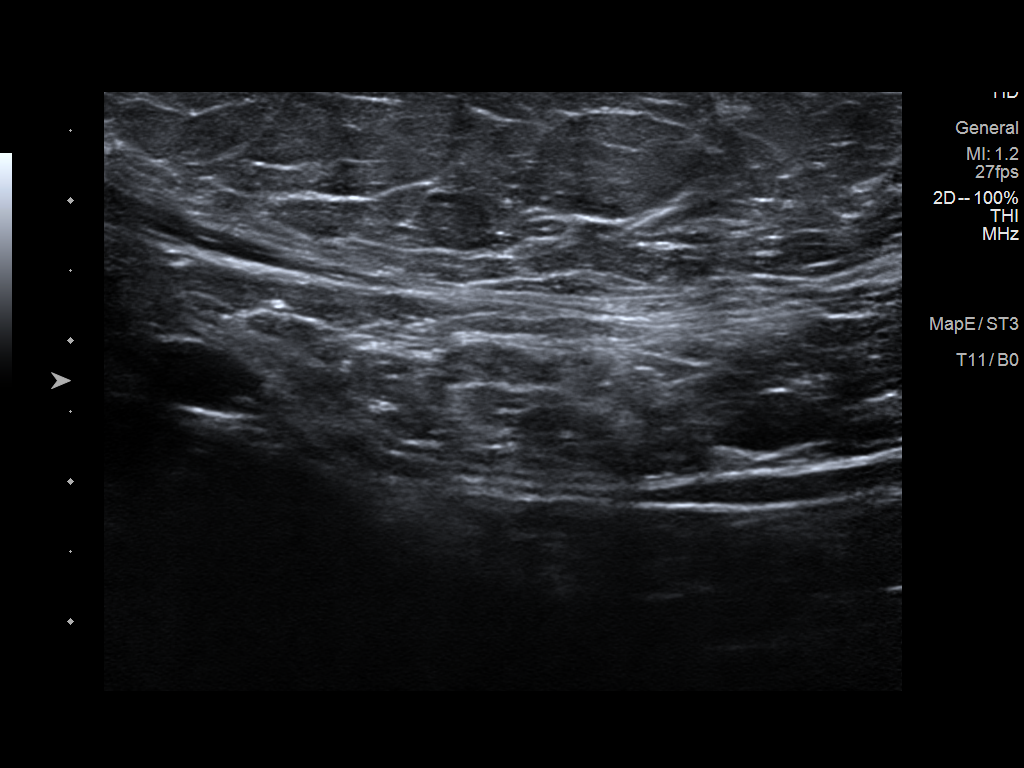
[im 4/6]
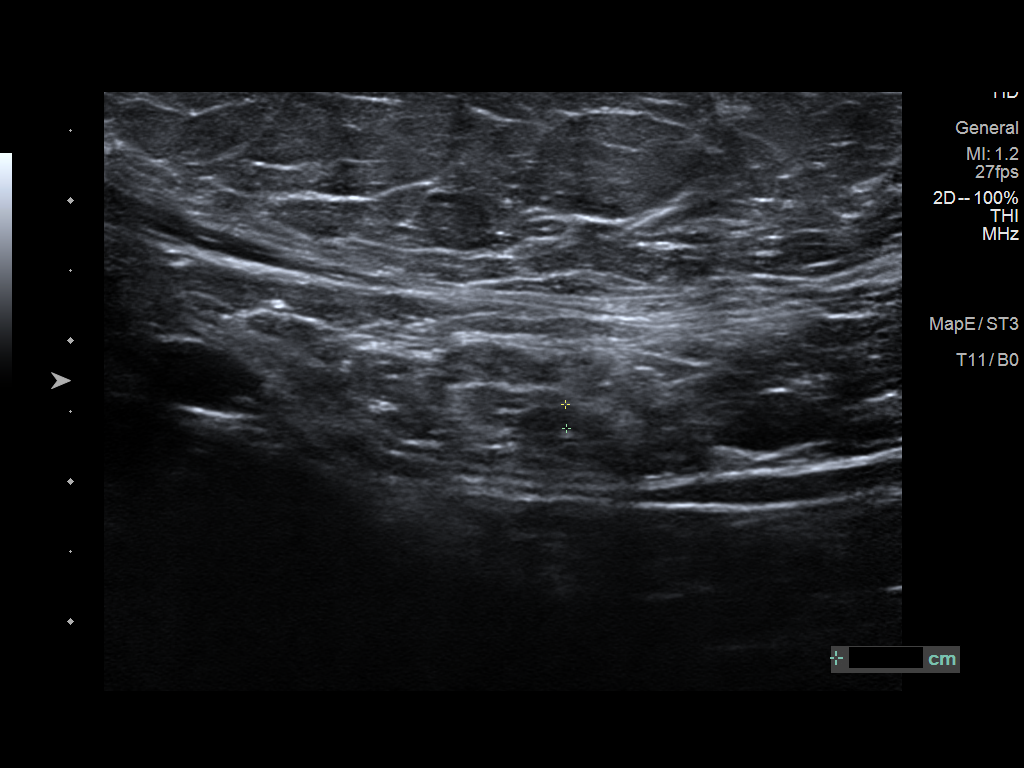
[im 5/6]
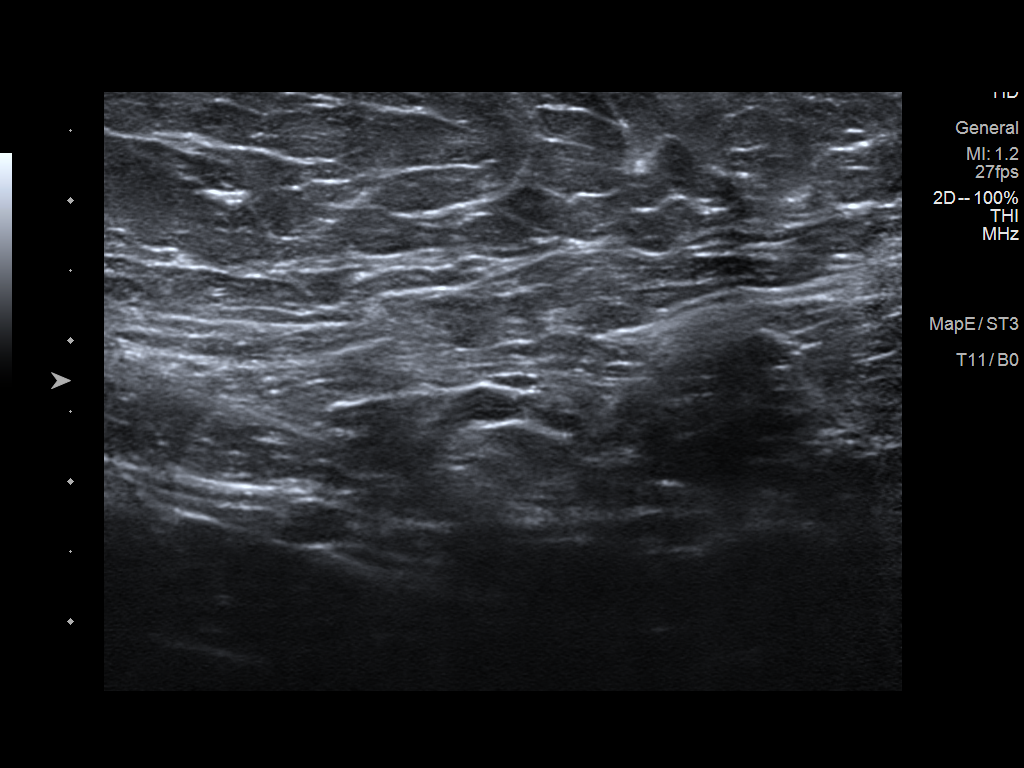
[im 6/6]
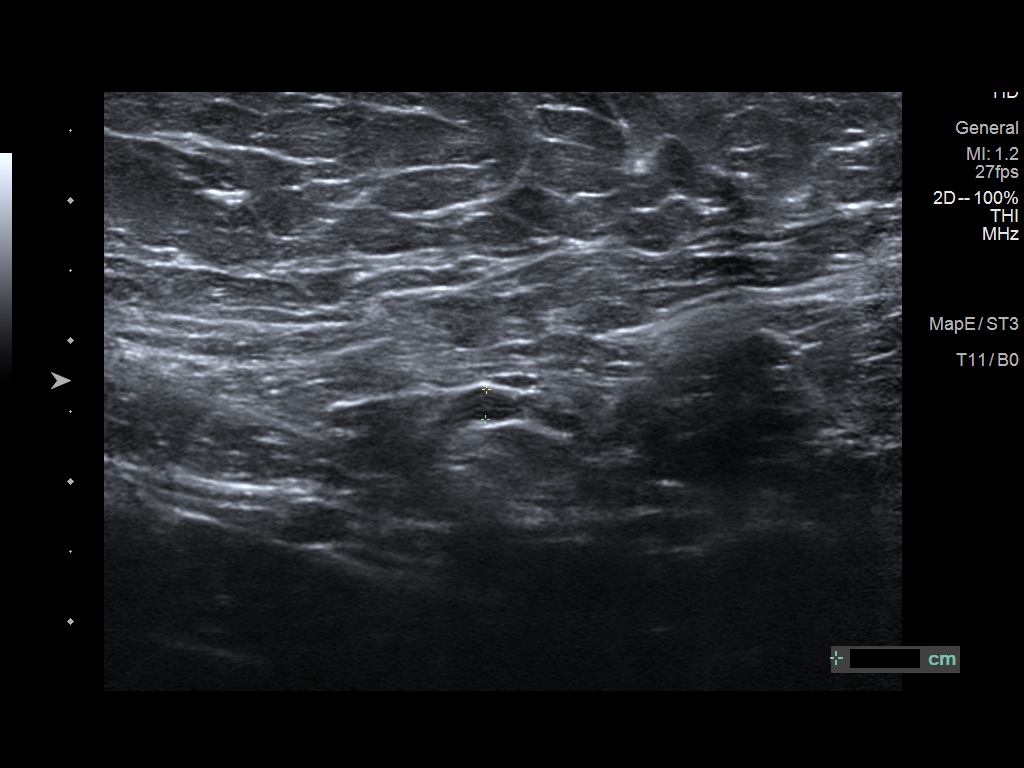

[6 of 6 positions shown; findings below may reference images not displayed]

ACR Breast Density Category c: The breast tissue is heterogeneously
dense, which may obscure small masses.
FINDINGS: Spot compression tomosynthesis images of the retroareolar breasts
bilaterally demonstrates no suspicious masses, areas of distortion,
calcifications or dilated ducts. No suspicious calcifications,
masses or areas of distortion are seen in the bilateral breasts.

Physical exam of the palpable sites in the right axilla demonstrates
firm ridges of tissue with similar feel and location under both
arms. No discrete palpable masses are identified.

Ultrasound of the right axilla demonstrates normal subcutaneous
tissue. No abnormal lymph nodes, masses or cysts are identified.

Ultrasound of the left axilla demonstrates normal subcutaneous
tissue. No abnormal lymph nodes, masses or cysts are identified.
IMPRESSION: 1. There are no suspicious mammographic abnormalities in the
retroareolar breasts bilaterally to correspond with the patient's
spontaneous bilateral milky nipple discharge.

2. No suspicious mammographic or targeted sonographic abnormalities
to correspond with the palpable lumps in the bilateral axillae.

RECOMMENDATION:
1. Further management of nipple discharge should be based on
clinical assessment. This is felt to be physiologic/related to a
benign process given that it is bilateral and milky in color. The
patient was advised to alert her doctor if she experiences
spontaneous unilateral bloody or clear nipple discharge from a
single duct only.

2. Clinical follow-up recommended for the palpable areas of concern
in the bilateral axillae. Any further workup should be based on
clinical grounds.

3. Return to routine screening mammography is recommended. The
patient will be due for screening in Saturday November, 2022.

I have discussed the findings and recommendations with the patient.
If applicable, a reminder letter will be sent to the patient
regarding the next appointment.

BI-RADS CATEGORY  1: Negative.

## 2023-10-01 IMAGING — MG DIGITAL DIAGNOSTIC BILAT W/ TOMO W/ CAD
4 series · 4 of 12 positions shown · non-contrast
Comparison: Previous exam(s).

CLINICAL DATA: 40-year-old female presenting for evaluation of
spontaneous bilateral milky nipple discharge for 2 months. She also
has bilateral palpable axillary lumps which she first noted in
Friday June, 2019 to July 2021. She has family history of breast
cancer in her maternal aunt in her 30s and a maternal cousin at age
26.

EXAM:
DIGITAL DIAGNOSTIC BILATERAL MAMMOGRAM WITH TOMOSYNTHESIS AND CAD;
US AXILLARY RIGHT; US AXILLARY LEFT
TECHNIQUE: Bilateral digital diagnostic mammography and breast tomosynthesis
was performed. The images were evaluated with computer-aided
detection.; Targeted ultrasound examination of the right axilla was
performed.; Targeted ultrasound examination of the left axilla was
performed.

[R MLO synth-2D]
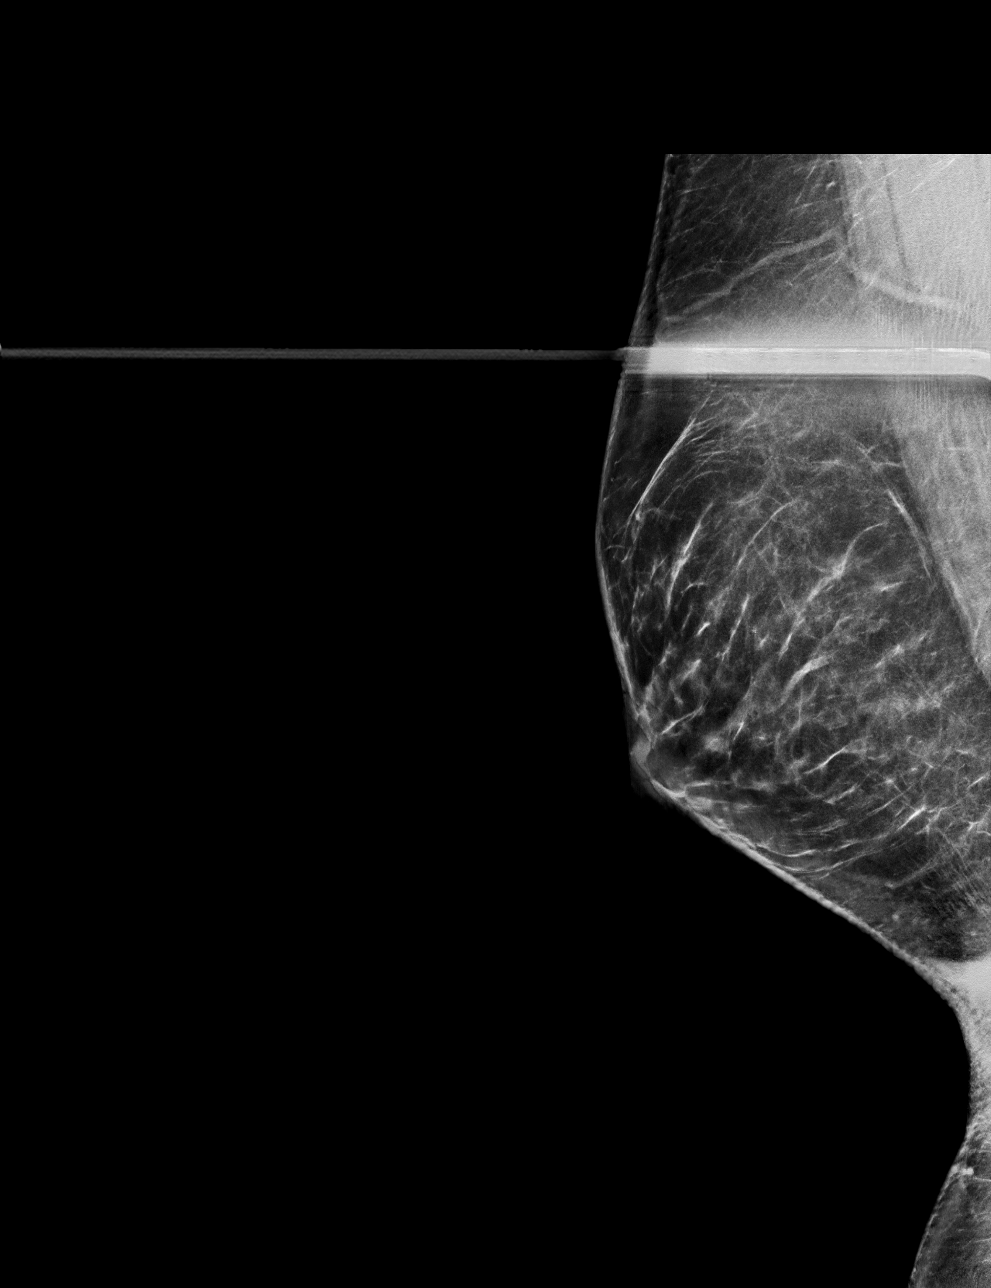

[L MLO synth-2D]
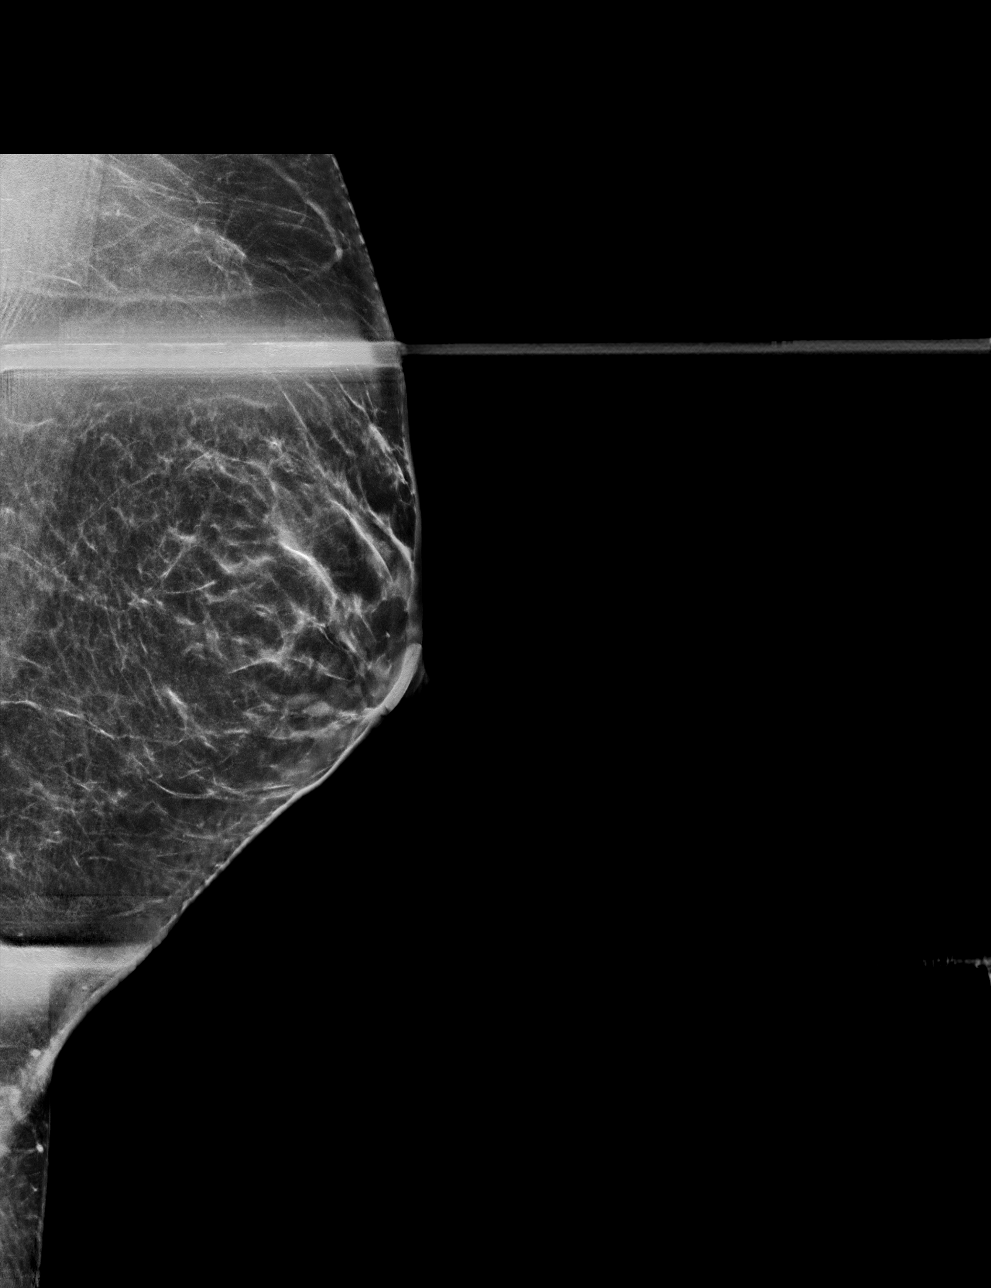

[R MLO tomo · tomo slice 36/71.0]
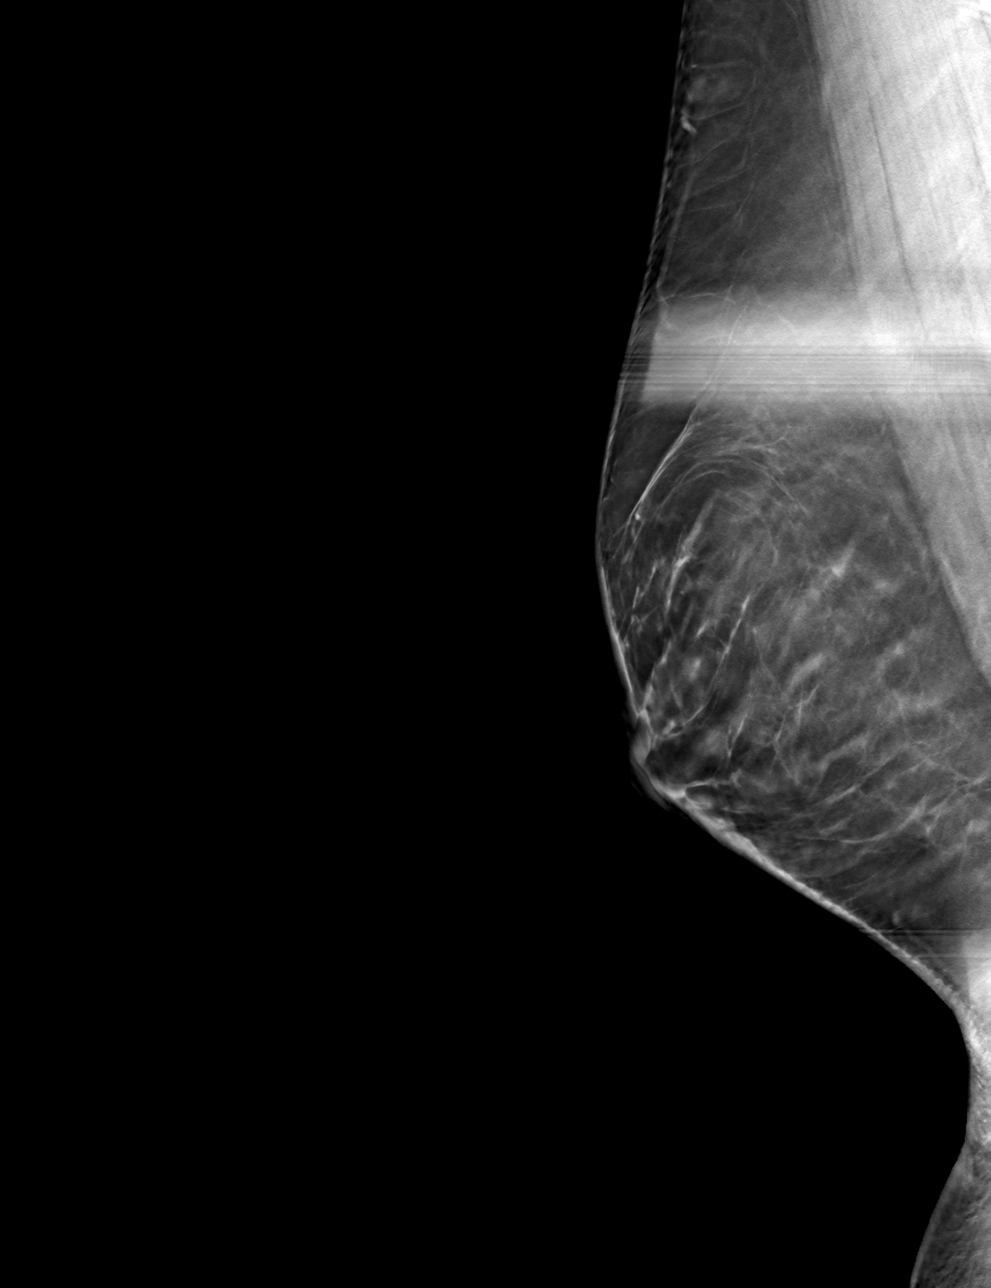

[L MLO tomo · tomo slice 31/61.0]
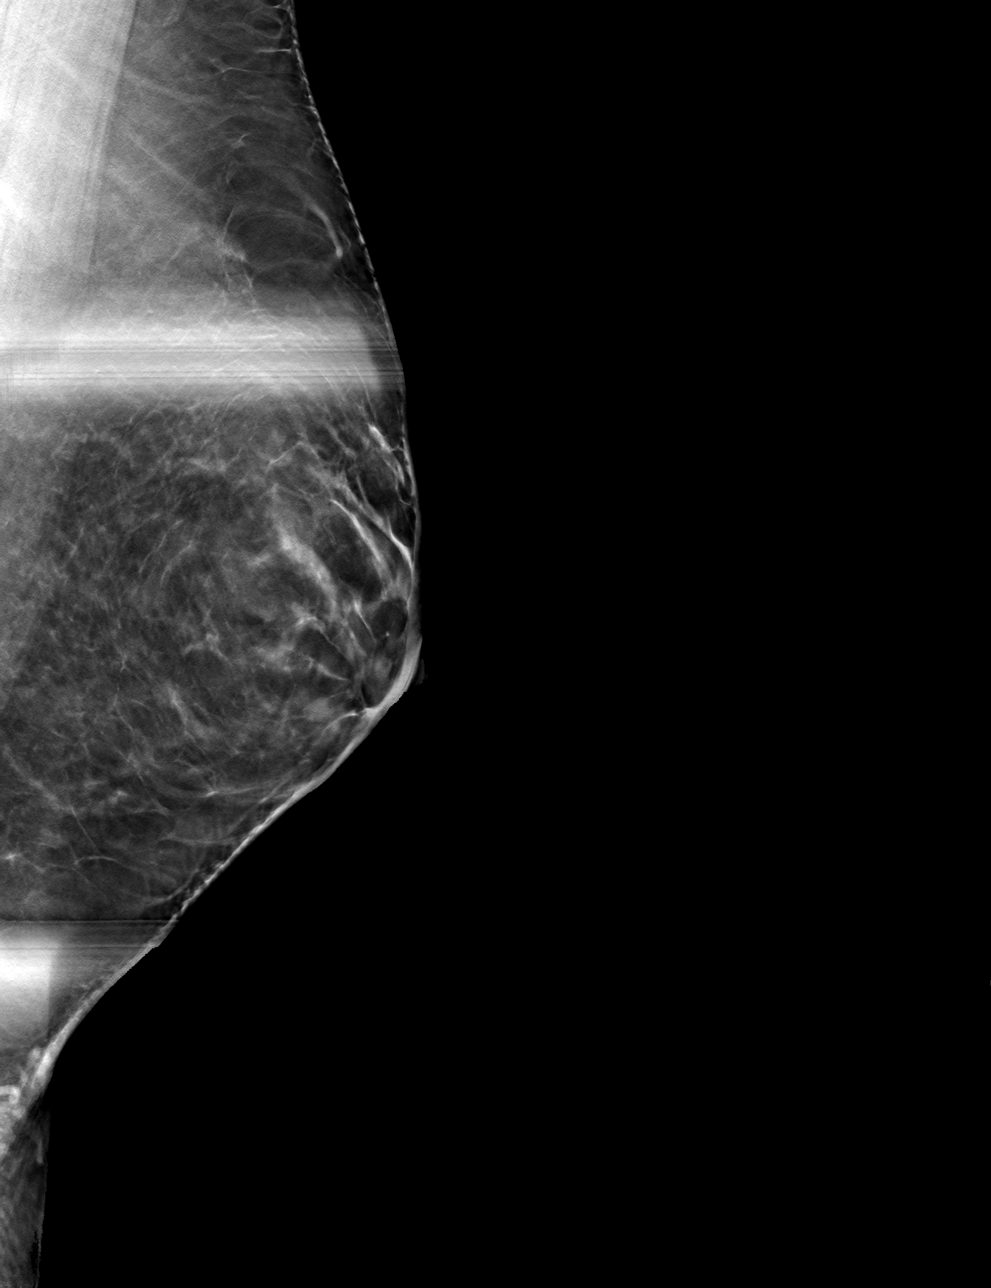

[4 of 12 positions shown; findings below may reference images not displayed]

ACR Breast Density Category c: The breast tissue is heterogeneously
dense, which may obscure small masses.
FINDINGS: Spot compression tomosynthesis images of the retroareolar breasts
bilaterally demonstrates no suspicious masses, areas of distortion,
calcifications or dilated ducts. No suspicious calcifications,
masses or areas of distortion are seen in the bilateral breasts.

Physical exam of the palpable sites in the right axilla demonstrates
firm ridges of tissue with similar feel and location under both
arms. No discrete palpable masses are identified.

Ultrasound of the right axilla demonstrates normal subcutaneous
tissue. No abnormal lymph nodes, masses or cysts are identified.

Ultrasound of the left axilla demonstrates normal subcutaneous
tissue. No abnormal lymph nodes, masses or cysts are identified.
IMPRESSION: 1. There are no suspicious mammographic abnormalities in the
retroareolar breasts bilaterally to correspond with the patient's
spontaneous bilateral milky nipple discharge.

2. No suspicious mammographic or targeted sonographic abnormalities
to correspond with the palpable lumps in the bilateral axillae.

RECOMMENDATION:
1. Further management of nipple discharge should be based on
clinical assessment. This is felt to be physiologic/related to a
benign process given that it is bilateral and milky in color. The
patient was advised to alert her doctor if she experiences
spontaneous unilateral bloody or clear nipple discharge from a
single duct only.

2. Clinical follow-up recommended for the palpable areas of concern
in the bilateral axillae. Any further workup should be based on
clinical grounds.

3. Return to routine screening mammography is recommended. The
patient will be due for screening in Saturday November, 2022.

I have discussed the findings and recommendations with the patient.
If applicable, a reminder letter will be sent to the patient
regarding the next appointment.

BI-RADS CATEGORY  1: Negative.

## 2024-01-27 ENCOUNTER — Emergency Department (HOSPITAL_BASED_OUTPATIENT_CLINIC_OR_DEPARTMENT_OTHER): Admitting: Radiology

## 2024-01-27 ENCOUNTER — Encounter (HOSPITAL_BASED_OUTPATIENT_CLINIC_OR_DEPARTMENT_OTHER): Payer: Self-pay | Admitting: *Deleted

## 2024-01-27 ENCOUNTER — Other Ambulatory Visit: Payer: Self-pay

## 2024-01-27 ENCOUNTER — Emergency Department (HOSPITAL_BASED_OUTPATIENT_CLINIC_OR_DEPARTMENT_OTHER)

## 2024-01-27 ENCOUNTER — Emergency Department (HOSPITAL_BASED_OUTPATIENT_CLINIC_OR_DEPARTMENT_OTHER)
Admission: EM | Admit: 2024-01-27 | Discharge: 2024-01-27 | Disposition: A | Discharge status: Home / Self Care | Source: Ambulatory Visit | Attending: Emergency Medicine | Admitting: Emergency Medicine

## 2024-01-27 DIAGNOSIS — F17210 Nicotine dependence, cigarettes, uncomplicated: Secondary | ICD-10-CM | POA: Diagnosis not present

## 2024-01-27 DIAGNOSIS — R0789 Other chest pain: Secondary | ICD-10-CM | POA: Insufficient documentation

## 2024-01-27 DIAGNOSIS — D72829 Elevated white blood cell count, unspecified: Secondary | ICD-10-CM | POA: Insufficient documentation

## 2024-01-27 DIAGNOSIS — R062 Wheezing: Secondary | ICD-10-CM | POA: Diagnosis not present

## 2024-01-27 LAB — TROPONIN T, HIGH SENSITIVITY: Troponin T High Sensitivity: <15 ng/L (ref <19)

## 2024-01-27 LAB — CBC
HCT: 43.1 % (ref 36.0–46.0)
Hemoglobin: 14.8 g/dL (ref 12.0–15.0)
MCH: 32.2 pg (ref 26.0–34.0)
MCHC: 34.3 g/dL (ref 30.0–36.0)
MCV: 93.7 fL (ref 80.0–100.0)
Platelets: 349 K/uL (ref 150–400)
RBC: 4.6 MIL/uL (ref 3.87–5.11)
RDW: 13.8 % (ref 11.5–15.5)
WBC: 13.8 K/uL — ABNORMAL HIGH (ref 4.0–10.5)
nRBC: 0 % (ref 0.0–0.2)

## 2024-01-27 LAB — BASIC METABOLIC PANEL WITH GFR
Anion gap: 14 (ref 5–15)
BUN: 13 mg/dL (ref 6–20)
CO2: 20 mmol/L — ABNORMAL LOW (ref 22–32)
Calcium: 9.8 mg/dL (ref 8.9–10.3)
Chloride: 105 mmol/L (ref 98–111)
Creatinine, Ser: 0.71 mg/dL (ref 0.44–1.00)
GFR, Estimated: >60 mL/min (ref >60)
Glucose, Bld: 115 mg/dL — ABNORMAL HIGH (ref 70–99)
Potassium: 3.9 mmol/L (ref 3.5–5.1)
Sodium: 139 mmol/L (ref 135–145)

## 2024-01-27 LAB — HCG, QUANTITATIVE, PREGNANCY: hCG, Beta Chain, Quant, S: <1 m[IU]/mL (ref <5)

## 2024-01-27 MED ORDER — CYCLOBENZAPRINE HCL 10 MG PO TABS
10.0000 mg | ORAL_TABLET | Freq: Once | ORAL | Status: AC
  Administered 2024-01-27: 10 mg via ORAL
  Filled 2024-01-27: qty 1

## 2024-01-27 MED ORDER — MORPHINE SULFATE 30 MG PO TABS: 30.0000 mg | ORAL_TABLET | Freq: Four times a day (QID) | ORAL | 0 refills | Status: DC | PRN

## 2024-01-27 MED ORDER — HYDROCODONE-ACETAMINOPHEN 5-325 MG PO TABS
1.0000 | ORAL_TABLET | Freq: Once | ORAL | Status: AC
  Administered 2024-01-27: 1 via ORAL
  Filled 2024-01-27: qty 1

## 2024-01-27 MED ORDER — IPRATROPIUM-ALBUTEROL 0.5-2.5 (3) MG/3ML IN SOLN
3.0000 mL | Freq: Once | RESPIRATORY_TRACT | Status: AC
  Administered 2024-01-27: 3 mL via RESPIRATORY_TRACT
  Filled 2024-01-27: qty 3

## 2024-01-27 MED ORDER — ALBUTEROL SULFATE (2.5 MG/3ML) 0.083% IN NEBU: 2.5000 mg | INHALATION_SOLUTION | Freq: Four times a day (QID) | RESPIRATORY_TRACT | 12 refills | Status: AC | PRN

## 2024-01-27 MED ORDER — LIDOCAINE 5 % EX PTCH
2.0000 | MEDICATED_PATCH | CUTANEOUS | Status: DC
  Administered 2024-01-27: 2 via TRANSDERMAL
  Filled 2024-01-27: qty 2

## 2024-01-27 MED ORDER — ALBUTEROL SULFATE (2.5 MG/3ML) 0.083% IN NEBU
2.5000 mg | INHALATION_SOLUTION | Freq: Once | RESPIRATORY_TRACT | Status: AC
  Administered 2024-01-27: 2.5 mg via RESPIRATORY_TRACT
  Filled 2024-01-27: qty 3

## 2024-01-27 MED ORDER — KETOROLAC TROMETHAMINE 15 MG/ML IJ SOLN
15.0000 mg | Freq: Once | INTRAMUSCULAR | Status: AC
  Administered 2024-01-27: 15 mg via INTRAVENOUS
  Filled 2024-01-27: qty 1

## 2024-01-27 MED ORDER — IOHEXOL 350 MG/ML SOLN
75.0000 mL | Freq: Once | INTRAVENOUS | Status: AC | PRN
  Administered 2024-01-27: 75 mL via INTRAVENOUS

## 2024-01-27 NOTE — Discharge Instructions (Addendum)
 As discussed, there is an area on your right sixth rib of sclerosis which could be from trauma or could be concerning for potential underlying malignancy or other abnormality.  This warrants further workup; recommend follow-up with your primary care for appropriate study.  Will send in medicine for pain as well as nebulizer to help with the wheezing.  Continue take steroids as prescribed by prior provider.  Please not hesitate to return to emergency department if the worrisome signs and symptoms we discussed become apparent.

## 2024-01-27 NOTE — ED Triage Notes (Signed)
 Patient to ED reporting right sided rib and chest pain. Pain has been present x 1 month and patient has been seen three times and dx with pneumonia that responded to antibiotics but now patient has been having recurrent pain dx as pleurisy. Patient seen 7/7 and given an inhaler and another round of steroids but pain has continued to worsen. Patient tearful in triage and appears to be in significant pain.

## 2024-01-27 NOTE — ED Provider Notes (Signed)
  EMERGENCY DEPARTMENT AT [facility] Provider Note   CSN: [id number] Arrival date & time: 01/27/24  1642     Patient presents with: Chest Pain   [name] is a 43 y.o. female.    Chest Pain   43 year old female presents emergency department with complaints of right-sided chest pain.  Has been having chest pain constant for the last month or so.  Worsened with movements and taking a deep breath.  States that she has been on treatment for pneumonia as well as bronchitis with antibiotic as well as prednisone  and breathing treatments of which she is currently taking prednisone  and a breathing treatment.  Denies any fevers, chills, abdominal pain, nausea, vomiting.  States that she is still coughing.  Does feel short of breath because she feels like she cannot take a deep breath due to worsening pain.  Denies any history of DVT/PE, recent surgery/immobilization, known malignancy, known coagulopathy, hormonal therapy.  Past medical history significant for constipation, hemorrhoid  Prior to Admission medications   Medication Sig Start Date End Date Taking? Authorizing Provider  esomeprazole (NEXIUM) 40 MG capsule Take 40 mg by mouth daily at 12 noon.    [provider]  HYDROcodone -acetaminophen  (NORCO/VICODIN) 5-325 MG tablet Take 1-2 tablets by mouth every 4 (four) hours as needed for severe pain. 12/30/18   Sheldon Standing, MD  Linaclotide  (LINZESS ) 290 MCG CAPS capsule Take 1 capsule (290 mcg total) by mouth daily. Patient not taking: Reported on 12/22/2018 11/15/14   Shirlean Therisa ORN, NP  meloxicam  (MOBIC ) 15 MG tablet Take 1 tablet (15 mg total) by mouth daily. Take with a meal. Patient not taking: Reported on 12/22/2018 08/04/18   Armida Culver, PA-C  methocarbamol  (ROBAXIN ) 500 MG tablet Take 1 tablet (500 mg total) by mouth every 8 (eight) hours as needed. 04/06/23   Patsey Lot, MD  polyethylene glycol (MIRALAX / GLYCOLAX) 17 g packet Take 17 g by mouth  daily.  02/09/14   [provider]  predniSONE  (DELTASONE ) 20 MG tablet Take 2 tablets (40 mg total) by mouth daily. 04/06/23   Patsey Lot, MD    Allergies: Doxycycline    Review of Systems  Cardiovascular:  Positive for chest pain.  All other systems reviewed and are negative.   Updated Vital Signs BP (!) 170/100 (BP Location: Left Arm)   Pulse 76   Temp 98.7 F (37.1 C) (Oral)   Resp 16   SpO2 100%   Physical Exam Vitals and nursing note reviewed.  Constitutional:      General: She is not in acute distress.    Appearance: She is well-developed.  HENT:     Head: Normocephalic and atraumatic.  Eyes:     Conjunctiva/sclera: Conjunctivae normal.  Cardiovascular:     Rate and Rhythm: Normal rate and regular rhythm.     Heart sounds: No murmur heard. Pulmonary:     Effort: Pulmonary effort is normal. No respiratory distress.     Breath sounds: Wheezing present.     Comments: Right-sided chest wall tenderness.  No obvious rash. Chest:     Chest wall: Tenderness present.  Abdominal:     Palpations: Abdomen is soft.     Tenderness: There is no abdominal tenderness.  Musculoskeletal:        General: No swelling.     Cervical back: Neck supple.  Skin:    General: Skin is warm and dry.     Capillary Refill: Capillary refill takes less than 2  seconds.  Neurological:     Mental Status: She is alert.  Psychiatric:        Mood and Affect: Mood normal.     (all labs ordered are listed, but only abnormal results are displayed) Labs Reviewed  BASIC METABOLIC PANEL WITH GFR - Abnormal; Notable for the following components:      Result Value   CO2 20 (*)    Glucose, Bld 115 (*)    All other components within normal limits  CBC - Abnormal; Notable for the following components:   WBC 13.8 (*)    All other components within normal limits    EKG: None  Radiology: DG Chest 2 View Result Date: 01/27/2024 CLINICAL DATA:  Chest pain. EXAM: CHEST - 2 VIEW  COMPARISON:  None Available. FINDINGS: The heart size and mediastinal contours are within normal limits. Both lungs are clear. The visualized skeletal structures are unremarkable. IMPRESSION: No active cardiopulmonary disease. Electronically Signed   By: Lynwood Landy Raddle M.D.   On: 01/27/2024 17:34     Procedures   Medications Ordered in the ED - No data to display                                  Medical Decision Making Amount and/or Complexity of Data Reviewed Labs: ordered. Radiology: ordered.  Risk Prescription drug management.   This patient presents to the ED for concern of chest wall pain, this involves an extensive number of treatment options, and is a complaint that carries with it a high risk of complications and morbidity.  The differential diagnosis includes ACS, PE, pneumothorax, pneumonia, hemothorax, HSV, costochondritis, pleurisy other   Co morbidities that complicate the patient evaluation  See HPI   Additional history obtained:  Additional history obtained from EMR External records from outside source obtained and reviewed including hospital records   Lab Tests:  I Ordered, and personally interpreted labs.  The pertinent results include: Leukocytosis of 15.8.  No evidence of anemia otherwise electrolytes and renal dysfunction.  Troponins was obtained essentially negative previous   Imaging Studies ordered:  I ordered imaging studies including chest x-ray, CT angio chest PE I independently visualized and interpreted imaging which showed  Chest x-ray: No acute cardiopulmonary abnormality CT angio chest PE: No acute cardiopulmonary process.  Sclerotic lesion right sixth ribs I agree with the radiologist interpretation   Cardiac Monitoring: / EKG:  The patient was maintained on a cardiac monitor.  I personally viewed and interpreted the cardiac monitored which showed an underlying rhythm of: Sinus rhythm   Consultations Obtained:  N/a   Problem  List / ED Course / Critical interventions / Medication management  Chest wall pain I ordered medication including Toradol , Flexeril , Lidoderm , DuoNeb, albuterol  Reevaluation of the patient after these medicines showed that the patient improved I have reviewed the patients home medicines and have made adjustments as needed   Social Determinants of Health:  Cigarette use.  Denies illicit drug use.   Test / Admission - Considered:  Right chest wall pain Vitals signs significant for height retention. Otherwise within normal range and stable throughout visit. Laboratory/imaging studies significant for: See above 43 year old female presents emergency department with complaints of right-sided chest pain.  Has been having chest pain constant for the last month or so.  Worsened with movements and taking a deep breath.  States that she has been on treatment for pneumonia as well as  bronchitis with antibiotic as well as prednisone  and breathing treatments of which she is currently taking prednisone  and a breathing treatment.  Denies any fevers, chills, abdominal pain, nausea, vomiting.  States that she is still coughing.  Does feel short of breath because she feels like she cannot take a deep breath due to worsening pain.  Denies any history of DVT/PE, recent surgery/immobilization, known malignancy, known coagulopathy, hormonal therapy. On exam, no wheezing appreciated bilateral lung fields.  Tender to palpation right-sided chest wall.  No obvious rash present.  Patient with negative troponin, lack of acute ischemic change on EKG; low suspicion for ACS.  CT PE study was obtained which did not show obvious pneumonia, pneumothorax, PE, there are no other acute cardiopulmonary abnormality.  Patient did have sclerotic lesion on right sixth rib and she is having some tenderness over this area.  Denies any recent or known trauma.  Patient did note improvement of breathing with nebulized treatment with some  improvement of pain with medications administered by the emergency department.  Per CT imaging, recommendations for nuclear medicine study for further assessment regarding sclerotic region on the right rib.  Will give patient incentive spirometer, send in medicine for pain and recommend follow-up with PCP for further assessment regarding lesion on right rib.  Patient already on corticosteroids as well as albuterol  inhaler for wheezing which will still be recommended.  Treatment plan discussed with patient and she acknowledged understanding was agreeable to said plan.  Patient well-appearing, afebrile in no acute distress. Worrisome signs and symptoms were discussed with the patient, and the patient acknowledged understanding to return to the ED if noticed. Patient was stable upon discharge.       Final diagnoses:  None    ED Discharge Orders     None          Silver Wonda LABOR, [place] 01/27/24 2005    Elnor Bernarda SQUIBB, DO 02/04/24 1526

## 2024-01-28 ENCOUNTER — Telehealth (HOSPITAL_BASED_OUTPATIENT_CLINIC_OR_DEPARTMENT_OTHER): Payer: Self-pay | Admitting: Emergency Medicine

## 2024-01-28 MED ORDER — MORPHINE SULFATE 30 MG PO TABS: 30.0000 mg | ORAL_TABLET | Freq: Four times a day (QID) | ORAL | 0 refills | Status: AC | PRN

## 2024-01-28 NOTE — Telephone Encounter (Signed)
 Asked to change pharmacy

## 2024-02-12 ENCOUNTER — Encounter: Payer: Self-pay | Admitting: Medical Oncology

## 2024-02-15 ENCOUNTER — Encounter: Payer: Self-pay | Admitting: Medical Oncology

## 2024-02-15 ENCOUNTER — Inpatient Hospital Stay: Attending: Hematology and Oncology

## 2024-02-15 ENCOUNTER — Inpatient Hospital Stay: Admitting: Hematology and Oncology

## 2024-02-15 VITALS — BP 144/93 | HR 79 | Temp 97.9°F | Resp 16 | Wt 163.2 lb

## 2024-02-15 DIAGNOSIS — Z8701 Personal history of pneumonia (recurrent): Secondary | ICD-10-CM | POA: Insufficient documentation

## 2024-02-15 DIAGNOSIS — Z803 Family history of malignant neoplasm of breast: Secondary | ICD-10-CM | POA: Diagnosis not present

## 2024-02-15 DIAGNOSIS — M899 Disorder of bone, unspecified: Secondary | ICD-10-CM | POA: Insufficient documentation

## 2024-02-15 DIAGNOSIS — F17210 Nicotine dependence, cigarettes, uncomplicated: Secondary | ICD-10-CM | POA: Diagnosis not present

## 2024-02-15 DIAGNOSIS — Z79899 Other long term (current) drug therapy: Secondary | ICD-10-CM | POA: Insufficient documentation

## 2024-02-15 DIAGNOSIS — I10 Essential (primary) hypertension: Secondary | ICD-10-CM | POA: Insufficient documentation

## 2024-02-15 DIAGNOSIS — Z8249 Family history of ischemic heart disease and other diseases of the circulatory system: Secondary | ICD-10-CM | POA: Insufficient documentation

## 2024-02-15 LAB — CMP (CANCER CENTER ONLY)
ALT: 15 U/L (ref 0–44)
AST: 11 U/L — ABNORMAL LOW (ref 15–41)
Albumin: 3.9 g/dL (ref 3.5–5.0)
Alkaline Phosphatase: 75 U/L (ref 38–126)
Anion gap: 5 (ref 5–15)
BUN: 16 mg/dL (ref 6–20)
CO2: 27 mmol/L (ref 22–32)
Calcium: 8.7 mg/dL — ABNORMAL LOW (ref 8.9–10.3)
Chloride: 108 mmol/L (ref 98–111)
Creatinine: 0.7 mg/dL (ref 0.44–1.00)
GFR, Estimated: >60 mL/min (ref >60)
Glucose, Bld: 88 mg/dL (ref 70–99)
Potassium: 3.8 mmol/L (ref 3.5–5.1)
Sodium: 140 mmol/L (ref 135–145)
Total Bilirubin: 0.4 mg/dL (ref 0.0–1.2)
Total Protein: 6.4 g/dL — ABNORMAL LOW (ref 6.5–8.1)

## 2024-02-15 LAB — CBC WITH DIFFERENTIAL (CANCER CENTER ONLY)
Abs Immature Granulocytes: 0.02 K/uL (ref 0.00–0.07)
Basophils Absolute: 0.1 K/uL (ref 0.0–0.1)
Basophils Relative: 1 %
Eosinophils Absolute: 0.1 K/uL (ref 0.0–0.5)
Eosinophils Relative: 1 %
HCT: 39.8 % (ref 36.0–46.0)
Hemoglobin: 13.6 g/dL (ref 12.0–15.0)
Immature Granulocytes: 0 %
Lymphocytes Relative: 24 %
Lymphs Abs: 1.9 K/uL (ref 0.7–4.0)
MCH: 32.1 pg (ref 26.0–34.0)
MCHC: 34.2 g/dL (ref 30.0–36.0)
MCV: 93.9 fL (ref 80.0–100.0)
Monocytes Absolute: 0.5 K/uL (ref 0.1–1.0)
Monocytes Relative: 6 %
Neutro Abs: 5.3 K/uL (ref 1.7–7.7)
Neutrophils Relative %: 68 %
Platelet Count: 296 K/uL (ref 150–400)
RBC: 4.24 MIL/uL (ref 3.87–5.11)
RDW: 13.2 % (ref 11.5–15.5)
WBC Count: 7.9 K/uL (ref 4.0–10.5)
nRBC: 0 % (ref 0.0–0.2)

## 2024-02-15 LAB — LACTATE DEHYDROGENASE: LDH: 143 U/L (ref 98–192)

## 2024-02-15 NOTE — Progress Notes (Signed)
 [facility]  Patient presented to clinic, with her husband, for her scheduled appointment with Dr. Federico. I introduced myself and provided them with my direct contact information. Patient and spouse were both encouraged to call me with any questions/concerns they may have.  Erin KYM Raider, RN, BSN, [facility] Oncology Nurse Navigator, [facility] 02/15/2024 9:35 AM

## 2024-02-15 NOTE — Progress Notes (Unsigned)
 [facility] [phone]   [phone]  [facility] CONSULT NOTE  Patient Care Team: Lynwood Laneta LELON DEVONNA as PCP - General (Family Medicine) Shaaron Lamar HERO, MD as Consulting Physician (Gastroenterology) Sheldon Standing, MD as Consulting Physician (General Surgery) Kristie Lamprey, MD as Consulting Physician (Gastroenterology) Golden Forestine BROCKS, RN as Oncology Nurse Navigator (Medical Oncology)  Hematological/Oncological History # Sclerotic Rib Lesion  01/27/2024: Patient underwent CT angio of the chest which revealed a mild focal sclerotic change in the right sixth rib lateral aspect, nonspecific. 02/04/2024: Nuclear medicine bone scan performed which showed focal uptake in the right sixth rib at the site of the sclerotic lesion.  Favored to represent a benign finding. 02/15/2024: establish care with [facility] clinic   CHIEF COMPLAINTS/PURPOSE OF CONSULTATION:  Sclerotic Rib Lesion    HISTORY OF PRESENTING ILLNESS:  [name] 43 y.o. female with medical history significant for constipation and hypertension who presents for evaluation of a sclerotic rib lesion.  On review of the previous records [name] was seen in the emergency department on 01/27/2024 at which time a CT angio of the chest was performed.  It revealed a mild sclerotic change in the right sixth rib.  This was thought to be nonspecific.  On 02/04/2024 patient underwent nuclear medicine bone scan which showed focal uptake in the right sixth rib at the site of the sclerotic lesion, favored to be a benign finding.  Due to concern for these findings the patient was referred to oncology for further evaluation and management.  On exam today [name] reports that she recently had a diagnosis of pneumonia and pleurisy.  She reports that she had 3 rounds of treatment but continued to have some pain and cough.  She reports that she would have severe coughing fits and that would worsen the pain in  the side of her chest.  She reports that she is also quite accident-prone but has had no recent falls or no damage to that side of the chest.  She reports that she has had no changes in her weight.  She reports that she last had a mammogram in 2023 and is due for another one this year.  She also is up-to-date on her colonoscopies.  On further discussion she reports that her mom had heart disease and her father had hypertension.  She reports had a maternal aunt that had breast cancer.  She has 1 healthy child.  She also had 2 maternal uncles with heart attacks and a brother who had a heart attack at age 5.  She is a current smoker smoking half a pack per day.  She reports that she seldom drinks alcohol.  She otherwise denies any fevers, chills, sweats, nausea, vomiting or diarrhea.  She reports her cough is improving and the pain is as well.  A full 10 point ROS is otherwise negative.  MEDICAL HISTORY:  Past Medical History:  Diagnosis Date   Constipation    Hemorrhoid     SURGICAL HISTORY: Past Surgical History:  Procedure Laterality Date   APPENDECTOMY     COLONOSCOPY  08/16/2009   Dr. Ladell anal canal otherwise normal/scattered left-sided diverticula   ENDOMETRIAL ABLATION     ESOPHAGOGASTRODUODENOSCOPY N/A 03/28/2014   RMR: gastric ulcer. Gastric erosions-status post gastric biopsy   right hip surgery     SPHINCTEROTOMY N/A 12/30/2018   Procedure: ANAL SPHINCTEROTOMY ANORECTAL EXAM UNDER ANESTHESIA WITH HEMORRHOIDECTOMY;  Surgeon: Sheldon Standing, MD;  Location: [facility];  Service:  General;  Laterality: N/A;    SOCIAL HISTORY: Social History   Socioeconomic History   Marital status: Married    Spouse name: Not on file   Number of children: Not on file   Years of education: Not on file   Highest education level: Not on file  Occupational History   Not on file  Tobacco Use   Smoking status: Every Day    Current packs/day: 0.50    Types: Cigarettes   Smokeless tobacco: Never   Vaping Use   Vaping status: Never Used  Substance and Sexual Activity   Alcohol use: Yes    Alcohol/week: 0.0 standard drinks of alcohol    Comment: occasional/rarely   Drug use: No    Types: Marijuana    Comment: in past   Sexual activity: Not on file  Other Topics Concern   Not on file  Social History Narrative   Not on file   Social Drivers of Health   Financial Resource Strain: Not on file  Food Insecurity: Low Risk  (10/29/2023)   Received from [facility]   Hunger Vital Sign    Within the past 12 months, you worried that your food would run out before you got money to buy more: Never true    Within the past 12 months, the food you bought just didn't last and you didn't have money to get more. : Never true  Transportation Needs: No Transportation Needs (10/29/2023)   Received from [facility]    In the past 12 months, has lack of reliable transportation kept you from medical appointments, meetings, work or from getting things needed for daily living? : No  Physical Activity: Not on file  Stress: Not on file  Social Connections: Not on file  Intimate Partner Violence: Not on file    FAMILY HISTORY: Family History  Problem Relation Age of Onset   Breast cancer Maternal Aunt        30s   Breast cancer Cousin 26   Colon cancer Neg Hx     ALLERGIES:  is allergic to doxycycline.  MEDICATIONS:  Current Outpatient Medications  Medication Sig Dispense Refill   cyanocobalamin 100 MCG tablet Take 100 mcg by mouth daily.     FLUoxetine (PROZAC) 40 MG capsule Take 40 mg by mouth daily.     losartan (COZAAR) 50 MG tablet Take 50 mg by mouth daily.     VITAMIN D, CHOLECALCIFEROL, PO Take by mouth.     albuterol  (PROVENTIL ) (2.5 MG/3ML) 0.083% nebulizer solution Take 3 mLs (2.5 mg total) by nebulization every 6 (six) hours as needed for wheezing or shortness of breath. 3 each 12   methocarbamol  (ROBAXIN ) 500 MG tablet Take 1 tablet (500 mg total) by mouth  every 8 (eight) hours as needed. 8 tablet 0   morphine  (MSIR) 30 MG tablet Take 1 tablet (30 mg total) by mouth every 6 (six) hours as needed for severe pain (pain score 7-10). (Patient not taking: Reported on 02/15/2024) 12 tablet 0   polyethylene glycol (MIRALAX / GLYCOLAX) 17 g packet Take 17 g by mouth daily.      No current facility-administered medications for this visit.    REVIEW OF SYSTEMS:   Constitutional: ( - ) fevers, ( - )  chills , ( - ) night sweats Eyes: ( - ) blurriness of vision, ( - ) double vision, ( - ) watery eyes Ears, nose, mouth, throat, and face: ( - ) mucositis, ( - )  sore throat Respiratory: ( - ) cough, ( - ) dyspnea, ( - ) wheezes Cardiovascular: ( - ) palpitation, ( - ) chest discomfort, ( - ) lower extremity swelling Gastrointestinal:  ( - ) nausea, ( - ) heartburn, ( - ) change in bowel habits Skin: ( - ) abnormal skin rashes Lymphatics: ( - ) new lymphadenopathy, ( - ) easy bruising Neurological: ( - ) numbness, ( - ) tingling, ( - ) new weaknesses Behavioral/Psych: ( - ) mood change, ( - ) new changes  All other systems were reviewed with the patient and are negative.  PHYSICAL EXAMINATION:  Vitals:   02/15/24 0921  BP: (!) 144/93  Pulse: 79  Resp: 16  Temp: 97.9 F (36.6 C)  SpO2: 98%   Filed Weights   02/15/24 0921  Weight: 163 lb 3.2 oz (74 kg)    GENERAL: well appearing middle-age Caucasian female in NAD  SKIN: skin color, texture, turgor are normal, no rashes or significant lesions EYES: conjunctiva are pink and non-injected, sclera clear LUNGS: clear to auscultation and percussion with normal breathing effort HEART: regular rate & rhythm and no murmurs and no lower extremity edema Musculoskeletal: no cyanosis of digits and no clubbing  PSYCH: alert & oriented x 3, fluent speech NEURO: no focal motor/sensory deficits  LABORATORY DATA:  I have reviewed the data as listed    Latest Ref Rng & Units 01/27/2024    5:03 PM 12/27/2018     9:03 AM 03/16/2014    5:46 PM  CBC  WBC 4.0 - 10.5 K/uL 13.8  11.2  9.2   Hemoglobin 12.0 - 15.0 g/dL 85.1  86.2  85.7   Hematocrit 36.0 - 46.0 % 43.1  41.8  40.6   Platelets 150 - 400 K/uL 349  296  256        Latest Ref Rng & Units 01/27/2024    5:03 PM 03/16/2014    5:46 PM 08/16/2009   10:50 AM  CMP  Glucose 70 - 99 mg/dL 884  89    BUN 6 - 20 mg/dL 13  13    Creatinine 9.55 - 1.00 mg/dL 9.28  9.29    Sodium 864 - 145 mmol/L 139  136    Potassium 3.5 - 5.1 mmol/L 3.9  3.8    Chloride 98 - 111 mmol/L 105  100    CO2 22 - 32 mmol/L 20  22    Calcium 8.9 - 10.3 mg/dL 9.8  8.9  8.5   Total Protein 6.0 - 8.3 g/dL  6.9    Total Bilirubin 0.3 - 1.2 mg/dL  0.3    Alkaline Phos 39 - 117 U/L  70    AST 0 - 37 U/L  17    ALT 0 - 35 U/L  16       ASSESSMENT & PLAN [name] 43 y.o. female with medical history significant for constipation and hypertension who presents for evaluation of a sclerotic rib lesion.  After review of the labs, review of the records, and discussion with the patient the patients findings are most consistent with a benign lesion of the rib, likely damage to the rib from severe coughing from her recent pneumonia.  # Sclerotic Rib Lesion  -- At this time favor a benign lesion, likely a partial fracture or damage to the rib caused by coughing fits from her pneumonia. -- In the interest of being thorough we will perform multiple myeloma workup with SPEP, serum  free light chains, and a CBC CMP -- Reassuring that nuclear medicine bone scan and CT chest showed no evidence of lesions elsewhere in the body. -- Recommend the patient stay up-to-date with her cancer screenings.  Last mammogram in June 2023. -- Return to clinic pending results of the above studies.  Orders Placed This Encounter  Procedures   CBC with Differential (Cancer Center Only)    Standing Status:   Future    Expiration Date:   02/14/2025   CMP (Cancer Center only)    Standing Status:   Future     Expiration Date:   02/14/2025   Lactate dehydrogenase (LDH)    Standing Status:   Future    Expiration Date:   02/14/2025   Multiple Myeloma Panel (SPEP&IFE w/QIG)    Standing Status:   Future    Expiration Date:   02/14/2025   Kappa/lambda light chains    Standing Status:   Future    Expiration Date:   02/14/2025    All questions were answered. The patient knows to call the clinic with any problems, questions or concerns.  A total of more than 45 minutes were spent on this encounter with face-to-face time and non-face-to-face time, including preparing to see the patient, ordering tests and/or medications, counseling the patient and coordination of care as outlined above.   Erin IVAR Kidney, MD Department of Hematology/Oncology [facility] at [facility] Phone: [phone] Pager: [phone] Email: [email or web address]  02/15/2024 9:31 AM

## 2024-02-16 LAB — KAPPA/LAMBDA LIGHT CHAINS
Kappa free light chain: 10.7 mg/L (ref 3.3–19.4)
Kappa, lambda light chain ratio: 0.72 (ref 0.26–1.65)
Lambda free light chains: 14.8 mg/L (ref 5.7–26.3)

## 2024-02-17 LAB — MULTIPLE MYELOMA PANEL, SERUM
Albumin SerPl Elph-Mcnc: 3.5 g/dL (ref 2.9–4.4)
Albumin/Glob SerPl: 1.5 (ref 0.7–1.7)
Alpha 1: 0.2 g/dL (ref 0.0–0.4)
Alpha2 Glob SerPl Elph-Mcnc: 0.6 g/dL (ref 0.4–1.0)
B-Globulin SerPl Elph-Mcnc: 0.8 g/dL (ref 0.7–1.3)
Gamma Glob SerPl Elph-Mcnc: 0.7 g/dL (ref 0.4–1.8)
Globulin, Total: 2.4 g/dL (ref 2.2–3.9)
IgA: 96 mg/dL (ref 87–352)
IgG (Immunoglobin G), Serum: 659 mg/dL (ref 586–1602)
IgM (Immunoglobulin M), Srm: 87 mg/dL (ref 26–217)
Total Protein ELP: 5.9 g/dL — ABNORMAL LOW (ref 6.0–8.5)

## 2024-02-24 ENCOUNTER — Encounter: Payer: Self-pay | Admitting: Oncology

## 2024-02-25 ENCOUNTER — Encounter: Payer: Self-pay | Admitting: Medical Oncology

## 2024-02-25 NOTE — Progress Notes (Signed)
 [facility]  Return call to patient regarding her request for lab results. Reviewed with Dr. Federico. Blood work resulted normal, and no concern for bone metastasis. Informed patient that rib fracture likely related to her recent pneumonia. Patient was encouraged to follow up with her PCP with any changes or Erin symptoms, otherwise no further workup or follow up with [facility] is required. Patient gave her verbal understanding and denies any questions at this time.      [facility]) for Malignancy  Hand-off Note  02/25/24 2:03 PM  [name] [birth year] [id number]  Cancer Care, Care Team: Dr. Norleen t. Federico [name], [facility] Nurse Navigator  [name] was referred to Cancer Care on 02/12/2024 for evaluation of:  abnormal chest CT .  The patient's diagnostic work-up included: Office consultation with Dr. Federico Labs: CBC w/Differential, CMP, Lactate dehydrongenase, Multiple Myeloma Panel, Kappa/lambda light chains,    The patient was found to not have malignancy at this time, as evaluated for the reason for referral stated above.  The recommended follow-up provided to the patient includes:  follow up with her PCP with any changes or Erin symptoms.  We thank you for allowing us  to assist in [name]'s care.  The initial and most recent Progress Notes, labs, imaging, procedure(s), and/or consult notes have been routed to you through [facility] or faxed to your office for continuity of care.   Erin KYM raider, RN, BSN, [facility] Oncology Nurse Navigator, [facility] 02/25/2024 1:59 PM

## 2024-03-09 ENCOUNTER — Encounter: Payer: Self-pay | Admitting: Medical Oncology

## 2024-07-18 ENCOUNTER — Encounter: Payer: Self-pay | Admitting: *Deleted

## 2024-07-19 ENCOUNTER — Other Ambulatory Visit: Payer: Self-pay | Admitting: Orthopedic Surgery

## 2024-07-19 DIAGNOSIS — R0789 Other chest pain: Secondary | ICD-10-CM

## 2024-07-20 ENCOUNTER — Ambulatory Visit
Admission: RE | Admit: 2024-07-20 | Discharge: 2024-07-20 | Disposition: A | Discharge status: Home / Self Care | Source: Ambulatory Visit | Attending: Orthopedic Surgery | Admitting: Orthopedic Surgery

## 2024-07-20 DIAGNOSIS — R0789 Other chest pain: Secondary | ICD-10-CM
